# Patient Record
Sex: Female | Born: 1965 | Race: White | Hispanic: No | Marital: Married | Smoking: Never smoker
Health system: Southern US, Community
[De-identification: ages and names within clinical notes are randomized; demographics above are authoritative.]

## PROBLEM LIST (undated history)

## (undated) DIAGNOSIS — E7800 Pure hypercholesterolemia, unspecified: Secondary | ICD-10-CM

## (undated) HISTORY — PX: CHOLECYSTECTOMY: SHX55

---

## 2012-04-08 ENCOUNTER — Encounter: Payer: Self-pay | Admitting: Emergency Medicine

## 2012-04-08 ENCOUNTER — Emergency Department (INDEPENDENT_AMBULATORY_CARE_PROVIDER_SITE_OTHER)
Admission: EM | Admit: 2012-04-08 | Discharge: 2012-04-08 | Disposition: A | Discharge status: Home / Self Care | Payer: BC Managed Care – PPO | Source: Home / Self Care

## 2012-04-08 DIAGNOSIS — B349 Viral infection, unspecified: Secondary | ICD-10-CM

## 2012-04-08 DIAGNOSIS — B9789 Other viral agents as the cause of diseases classified elsewhere: Secondary | ICD-10-CM

## 2012-04-08 LAB — POCT MONO SCREEN (KUC): Mono, POC: NEGATIVE

## 2012-04-08 LAB — POCT CBC W AUTO DIFF (K'VILLE URGENT CARE)

## 2012-04-08 LAB — POCT INFLUENZA A/B
Influenza A, POC: NEGATIVE
Influenza B, POC: NEGATIVE

## 2012-04-08 NOTE — ED Notes (Signed)
Sore throat, chills, body aches,sweats, headache x 3 days

## 2012-04-08 NOTE — ED Provider Notes (Signed)
History     CSN: [id number]  Arrival date & time 04/08/12  1478   First MD Initiated Contact with Patient 04/08/12 1910      Chief Complaint  Patient presents with  . Sore Throat   HPI URI Symptoms Onset: 3 days  Description: rhinorrhea, nasal congestion, sore throat, cough, body aches, chills, headache  Modifying factors:  None   Symptoms Nasal discharge: yes Fever: no Sore throat: yes Cough: yes Wheezing: no Ear pain: no GI symptoms: no Sick contacts: yes; husband with similar sxs   Red Flags  Stiff neck: no Dyspnea: no Rash: no Swallowing difficulty: no  Sinusitis Risk Factors Headache/face pain: yes Double sickening: no tooth pain: no  Allergy Risk Factors Sneezing: no Itchy scratchy throat: no Seasonal symptoms: no  Flu Risk Factors Headache: yes muscle aches: yes severe fatigue: yes    History reviewed. No pertinent past medical history.  Past Surgical History  Procedure Date  . Cholecystectomy   . Cesarean section     No family history on file.  History  Substance Use Topics  . Smoking status: Never Smoker   . Smokeless tobacco: Not on file  . Alcohol Use: Yes    OB History    Grav Para Term Preterm Abortions TAB SAB Ect Mult Living                  Review of Systems  All other systems reviewed and are negative.    Allergies  Erythromycin  Home Medications  No current outpatient prescriptions on file.  BP 127/82  Pulse 99  Temp 98 F (36.7 C) (Oral)  Resp 16  Ht 5\' 6"  (1.676 m)  Wt 141 lb (63.957 kg)  BMI 22.76 kg/m2  SpO2 99%  Physical Exam  Constitutional: She appears well-developed and well-nourished.  HENT:  Head: Normocephalic and atraumatic.  Right Ear: External ear normal.  Left Ear: External ear normal.       +nasal erythema, rhinorrhea bilaterally, + post oropharyngeal erythema  ? Tonsillar exudate   Eyes: Conjunctivae normal are normal. Pupils are equal, round, and reactive to light.  Neck:  Normal range of motion. Neck supple.  Cardiovascular: Normal rate, regular rhythm and normal heart sounds.   Pulmonary/Chest: Effort normal and breath sounds normal.  Abdominal: Soft.  Musculoskeletal: Normal range of motion.  Lymphadenopathy:    She has cervical adenopathy.  Neurological: She is alert.  Skin: Skin is warm.    ED Course  Procedures (including critical care time)   Labs Reviewed  POCT CBC W AUTO DIFF ([facility])  POCT MONO SCREEN ([facility])  POCT INFLUENZA A/B  STREP A DNA PROBE   No results found.   1. Viral illness       MDM  Suspect viral/flu like illness.  Rapid strep, rapid flu, mono negative.  Will add on strep culture given ? Tonsillar exudate.  Discussed supportive care and infectious red flags at length.  Otherwise follow up as needed.     The patient and/or caregiver has been counseled thoroughly with regard to treatment plan and/or medications prescribed including dosage, schedule, interactions, rationale for use, and possible side effects and they verbalize understanding. Diagnoses and expected course of recovery discussed and will return if not improved as expected or if the condition worsens. Patient and/or caregiver verbalized understanding.             Doree Albee, MD 04/08/12 2049

## 2012-04-10 ENCOUNTER — Telehealth: Payer: Self-pay | Admitting: Emergency Medicine

## 2012-04-13 ENCOUNTER — Emergency Department (INDEPENDENT_AMBULATORY_CARE_PROVIDER_SITE_OTHER)
Admission: EM | Admit: 2012-04-13 | Discharge: 2012-04-13 | Disposition: A | Discharge status: Home / Self Care | Payer: BC Managed Care – PPO | Source: Home / Self Care

## 2012-04-13 ENCOUNTER — Emergency Department (INDEPENDENT_AMBULATORY_CARE_PROVIDER_SITE_OTHER): Payer: BC Managed Care – PPO

## 2012-04-13 ENCOUNTER — Encounter: Payer: Self-pay | Admitting: *Deleted

## 2012-04-13 DIAGNOSIS — R0602 Shortness of breath: Secondary | ICD-10-CM

## 2012-04-13 DIAGNOSIS — J069 Acute upper respiratory infection, unspecified: Secondary | ICD-10-CM

## 2012-04-13 DIAGNOSIS — J40 Bronchitis, not specified as acute or chronic: Secondary | ICD-10-CM

## 2012-04-13 DIAGNOSIS — R05 Cough: Secondary | ICD-10-CM

## 2012-04-13 DIAGNOSIS — R918 Other nonspecific abnormal finding of lung field: Secondary | ICD-10-CM

## 2012-04-13 DIAGNOSIS — R062 Wheezing: Secondary | ICD-10-CM

## 2012-04-13 MED ORDER — PREDNISONE 50 MG PO TABS: ORAL_TABLET | ORAL | Status: DC

## 2012-04-13 MED ORDER — AZITHROMYCIN 250 MG PO TABS: ORAL_TABLET | ORAL | Status: DC

## 2012-04-13 MED ORDER — HYDROCOD POLST-CHLORPHEN POLST 10-8 MG/5ML PO LQCR: 5.0000 mL | Freq: Two times a day (BID) | ORAL | Status: DC | PRN

## 2012-04-13 MED ORDER — ALBUTEROL SULFATE HFA 108 (90 BASE) MCG/ACT IN AERS: 2.0000 | INHALATION_SPRAY | RESPIRATORY_TRACT | Status: DC | PRN

## 2012-04-13 MED ORDER — METHYLPREDNISOLONE SODIUM SUCC 125 MG IJ SOLR
125.0000 mg | Freq: Once | INTRAMUSCULAR | Status: AC
  Administered 2012-04-13: 125 mg via INTRAMUSCULAR

## 2012-04-13 NOTE — ED Notes (Signed)
Patient reports feeling worse from the last time she was here 5 days ago. She now has a constant non-productive cough. She has taken Nyquil, Mucinex D, Robitussin and tessalon without relief. Denies fever.

## 2012-04-13 NOTE — ED Provider Notes (Addendum)
History     CSN: [id number]  Arrival date & time 04/13/12  1030   First MD Initiated Contact with Patient 04/13/12 1034      Chief Complaint  Patient presents with  . Cough    HPI URI Symptoms Onset: 1 week  Description: sinus drainage, persistent cough, SOB, wheezing Modifying factors:  Was seen for similar sxs last week, sxs not improved. Pt reports ? Hx/o subclinical asthma in the past. Had no wheezing/SOB last week.   Symptoms Nasal discharge: yes Fever: no Sore throat: no Cough: yes Wheezing: yes Ear pain: no GI symptoms: sour stomach Sick contacts: yes; husband  Red Flags  Stiff neck: no Dyspnea: yes Rash: no Swallowing difficulty: no  Sinusitis Risk Factors Headache/face pain: no Double sickening: no tooth pain: no  Allergy Risk Factors Sneezing: no Itchy scratchy throat: no Seasonal symptoms: no  Flu Risk Factors Headache: no muscle aches: yes severe fatigue: yes   History reviewed. No pertinent past medical history.  Past Surgical History  Procedure Date  . Cholecystectomy   . Cesarean section     No family history on file.  History  Substance Use Topics  . Smoking status: Never Smoker   . Smokeless tobacco: Not on file  . Alcohol Use: No    OB History    Grav Para Term Preterm Abortions TAB SAB Ect Mult Living                  Review of Systems  All other systems reviewed and are negative.    Allergies  Erythromycin  Home Medications  No current outpatient prescriptions on file.  BP 103/73  Pulse 99  Temp 98 F (36.7 C) (Oral)  Resp 16  Wt 135 lb (61.236 kg)  SpO2 100%  Physical Exam  Constitutional: She appears well-developed and well-nourished.  HENT:  Head: Normocephalic and atraumatic.  Right Ear: External ear normal.  Left Ear: External ear normal.       +nasal erythema, rhinorrhea bilaterally, + post oropharyngeal erythema    Eyes: Conjunctivae normal are normal. Pupils are equal, round, and reactive  to light.  Neck: Normal range of motion. Neck supple.  Cardiovascular: Normal rate and regular rhythm.   Pulmonary/Chest: Effort normal.       Faint wheezes diffusely Mild rales in LLL   Abdominal: Soft.  Musculoskeletal: Normal range of motion.  Lymphadenopathy:    She has cervical adenopathy.  Neurological: She is alert.  Skin: Skin is warm.    ED Course  Procedures (including critical care time)  Labs Reviewed - No data to display Dg Chest 2 View  04/13/2012  *RADIOLOGY REPORT*  Clinical Data: Cough for 1 week, shortness of breath  CHEST - 2 VIEW  Comparison: None.  Findings: Minimally prominent markings are noted on the lateral view either within the right middle lobe or lingula and a patchy pneumonia cannot be excluded.  There is some peribronchial thickening which may indicate bronchitis.  No effusion is noted. The heart is within normal limits in size.  No bony abnormality is seen.  IMPRESSION:  1.  Peribronchial thickening consistent with bronchitis. 2.  Minimally prominent markings anteriorly on the lateral view. Cannot exclude small infiltrate within the right middle lobe or lingula. Consider follow-up chest x-ray.   Original Report Authenticated By: Dwyane Dee, M.D.      1. URI (upper respiratory infection)   2. Bronchitis   3. Wheezing       MDM  Solumedrol  125mg  IM x1 Prednisone 50mg  daily x 5 days.  Zpak for atypical/PNA coverage.  Tussionex for cough.  Albuterol for home use. If this improves cough/sxs, would recommend outpt PFT 4-6 weeks after resolution of sxs.     The patient and/or caregiver has been counseled thoroughly with regard to treatment plan and/or medications prescribed including dosage, schedule, interactions, rationale for use, and possible side effects and they verbalize understanding. Diagnoses and expected course of recovery discussed and will return if not improved as expected or if the condition worsens. Patient and/or caregiver verbalized  understanding.             Doree Albee, MD 04/13/12 1157  Doree Albee, MD 04/13/12 [phone]

## 2012-04-18 ENCOUNTER — Emergency Department (INDEPENDENT_AMBULATORY_CARE_PROVIDER_SITE_OTHER): Payer: BC Managed Care – PPO

## 2012-04-18 ENCOUNTER — Emergency Department (INDEPENDENT_AMBULATORY_CARE_PROVIDER_SITE_OTHER)
Admission: EM | Admit: 2012-04-18 | Discharge: 2012-04-18 | Disposition: A | Discharge status: Home / Self Care | Payer: BC Managed Care – PPO | Source: Home / Self Care

## 2012-04-18 DIAGNOSIS — R05 Cough: Secondary | ICD-10-CM

## 2012-04-18 DIAGNOSIS — J069 Acute upper respiratory infection, unspecified: Secondary | ICD-10-CM

## 2012-04-18 MED ORDER — OMEPRAZOLE 20 MG PO CPDR: 20.0000 mg | DELAYED_RELEASE_CAPSULE | Freq: Every day | ORAL | Status: DC

## 2012-04-18 MED ORDER — PREDNISONE 50 MG PO TABS: ORAL_TABLET | ORAL | Status: DC

## 2012-04-18 MED ORDER — CETIRIZINE HCL 10 MG PO TABS: 10.0000 mg | ORAL_TABLET | Freq: Every day | ORAL | Status: DC

## 2012-04-18 NOTE — ED Provider Notes (Signed)
History     CSN: [id number]  Arrival date & time 04/18/12  1114   First MD Initiated Contact with Patient 04/18/12 1129      Chief Complaint  Patient presents with  . Cough    Follow up cough   HPI Pt presents today for follow up of URI/asthma exacerbation/bronchitis.  Pt was placed on course of prednisone and zpak. Pt states that she is symptomtically improved with cough and SOB.  Pt states that she has had persistent sinus drainage as well as cough (though improved).  No SOB.  Has had mild fatigue. Pt states that she still has been working.  No fevers, chills.   History reviewed. No pertinent past medical history.  Past Surgical History  Procedure Date  . Cholecystectomy   . Cesarean section     Family History  Problem Relation Age of Onset  . Cancer Other     BREAST    History  Substance Use Topics  . Smoking status: Never Smoker   . Smokeless tobacco: Not on file  . Alcohol Use: No    OB History    Grav Para Term Preterm Abortions TAB SAB Ect Mult Living                  Review of Systems  All other systems reviewed and are negative.    Allergies  Erythromycin  Home Medications   Current Outpatient Rx  Name  Route  Sig  Dispense  Refill  . ALBUTEROL SULFATE HFA 108 (90 BASE) MCG/ACT IN AERS   Inhalation   Inhale 2 puffs into the lungs every 4 (four) hours as needed for wheezing (cough, shortness of breath or wheezing.).   1 Inhaler   1   . AZITHROMYCIN 250 MG PO TABS      Take 2 tabs PO x 1 dose, then 1 tab PO QD x 4 days   6 tablet   0   . HYDROCOD POLST-CPM POLST ER 10-8 MG/5ML PO LQCR   Oral   Take 5 mLs by mouth every 12 (twelve) hours as needed (cough).   60 mL   0   . PREDNISONE 50 MG PO TABS      1 tab daily x 5 days   5 tablet   0     BP 110/73  Pulse 73  Temp 97.8 F (36.6 C) (Oral)  Ht 5\' 6"  (1.676 m)  Wt 136 lb (61.689 kg)  BMI 21.95 kg/m2  SpO2 98%  LMP 03/25/2012  Physical Exam  Constitutional: She  appears well-developed and well-nourished.  HENT:  Head: Normocephalic and atraumatic.  Right Ear: External ear normal.  Left Ear: External ear normal.       +nasal erythema, rhinorrhea bilaterally, + post oropharyngeal erythema    Eyes: Conjunctivae normal are normal. Pupils are equal, round, and reactive to light.  Neck: Normal range of motion. Neck supple.  Cardiovascular: Normal rate, regular rhythm and normal heart sounds.   Pulmonary/Chest: Effort normal and breath sounds normal. She has no wheezes.  Abdominal: Soft. Bowel sounds are normal.  Musculoskeletal: Normal range of motion.  Lymphadenopathy:    She has no cervical adenopathy.  Neurological: She is alert.  Skin: Skin is warm.    ED Course  Procedures (including critical care time)  Labs Reviewed - No data to display Dg Chest 2 View  04/18/2012  *RADIOLOGY REPORT*  Clinical Data:  Cough.  CHEST - 2 VIEW  Comparison: 04/13/2012  Findings:  The heart size and mediastinal contours are within normal limits.  Both lungs are clear.  The visualized skeletal structures are unremarkable.  IMPRESSION: No active disease.   Original Report Authenticated By: Irish Lack, M.D.      1. URI (upper respiratory infection)   2. Cough       MDM  Suspect this is likely multifactorial with contributions of URI/post viral cough, rhinorrhea, asthma, as well as uncontrolled reflux (pt states that she has reflux flare while on prednisone-untreated).  CXR negative for PNA.  Asthma seems relatively improved from last clinical visit based on exam and pt history.  Zyrtec for rhinorrhea (likely allergic component).  Prilosec for reflux.  Prednisone refill given if cough or asthma flares despite treatment with albuterol.  Discussed infectious and resp red flags at length.  Also discussed importance of adequate rest.  Plan for follow up with PCP in 7-10 days if sxs not improved.      The patient and/or caregiver has been counseled  thoroughly with regard to treatment plan and/or medications prescribed including dosage, schedule, interactions, rationale for use, and possible side effects and they verbalize understanding. Diagnoses and expected course of recovery discussed and will return if not improved as expected or if the condition worsens. Patient and/or caregiver verbalized understanding.              Doree Albee, MD 04/18/12 1248

## 2012-04-18 NOTE — ED Notes (Signed)
Jillian Meza is here for follow up cough, chest congestion and fatigue. She feels better. She is still very fatigued and has night sweats.

## 2012-11-29 ENCOUNTER — Emergency Department
Admission: EM | Admit: 2012-11-29 | Discharge: 2012-11-29 | Disposition: A | Discharge status: Home / Self Care | Payer: BC Managed Care – PPO | Source: Home / Self Care | Attending: Family Medicine | Admitting: Family Medicine

## 2012-11-29 ENCOUNTER — Encounter: Payer: Self-pay | Admitting: Emergency Medicine

## 2012-11-29 DIAGNOSIS — R300 Dysuria: Secondary | ICD-10-CM

## 2012-11-29 DIAGNOSIS — N3000 Acute cystitis without hematuria: Secondary | ICD-10-CM

## 2012-11-29 LAB — POCT URINALYSIS DIP (MANUAL ENTRY)
Bilirubin, UA: NEGATIVE
Ketones, POC UA: NEGATIVE
Protein Ur, POC: NEGATIVE
Spec Grav, UA: <=1.005 (ref 1.005–1.03)
pH, UA: 6.5 (ref 5–8)

## 2012-11-29 MED ORDER — PHENAZOPYRIDINE HCL 200 MG PO TABS: 200.0000 mg | ORAL_TABLET | Freq: Three times a day (TID) | ORAL | Status: DC

## 2012-11-29 MED ORDER — SULFAMETHOXAZOLE-TRIMETHOPRIM 800-160 MG PO TABS: 1.0000 | ORAL_TABLET | Freq: Two times a day (BID) | ORAL | Status: DC

## 2012-11-29 NOTE — ED Notes (Signed)
Reports intermittent dysuria x 3 days; noticed hematuria and more pain this morning. No OTCs.

## 2012-11-29 NOTE — ED Provider Notes (Signed)
History    CSN: [id number] Arrival date & time 11/29/12  1124  First MD Initiated Contact with Patient 11/29/12 1225     Chief Complaint  Patient presents with  . Dysuria      HPI Comments: Patient complains of 3 day history of bladder pressure.  Yesterday she developed dysuria, nocturia, and hematuria.    Patient is a 47 y.o. female presenting with dysuria. The history is provided by the patient.  Dysuria Pain quality:  Burning Pain severity:  Mild Onset quality:  Gradual Duration:  3 days Timing:  Constant Progression:  Worsening Chronicity:  New Recent urinary tract infections: no   Relieved by:  Nothing Worsened by:  Nothing tried Ineffective treatments:  None tried Urinary symptoms: discolored urine, frequent urination, hematuria and hesitancy   Urinary symptoms: no foul-smelling urine and no bladder incontinence   Associated symptoms: no abdominal pain, no fever, no flank pain, no genital lesions, no nausea, no vaginal discharge and no vomiting    History reviewed. No pertinent past medical history. Past Surgical History  Procedure Laterality Date  . Cholecystectomy    . Cesarean section     Family History  Problem Relation Age of Onset  . Cancer Other     BREAST   History  Substance Use Topics  . Smoking status: Never Smoker   . Smokeless tobacco: Not on file  . Alcohol Use: No   OB History   Grav Para Term Preterm Abortions TAB SAB Ect Mult Living                 Review of Systems  Constitutional: Negative for fever.  Gastrointestinal: Negative for nausea, vomiting and abdominal pain.  Genitourinary: Positive for dysuria. Negative for flank pain and vaginal discharge.  All other systems reviewed and are negative.    Allergies  Erythromycin  Home Medications   Current Outpatient Rx  Name  Route  Sig  Dispense  Refill  . albuterol (PROVENTIL HFA;VENTOLIN HFA) 108 (90 BASE) MCG/ACT inhaler   Inhalation   Inhale 2 puffs into the lungs every 4  (four) hours as needed for wheezing (cough, shortness of breath or wheezing.).   1 Inhaler   1   . cetirizine (ZYRTEC) 10 MG tablet   Oral   Take 1 tablet (10 mg total) by mouth daily.   30 tablet   3   . omeprazole (PRILOSEC) 20 MG capsule   Oral   Take 1 capsule (20 mg total) by mouth daily.   30 capsule   3   . phenazopyridine (PYRIDIUM) 200 MG tablet   Oral   Take 1 tablet (200 mg total) by mouth 3 (three) times daily. Take with food.   6 tablet   0   . predniSONE (DELTASONE) 50 MG tablet      1 tab daily x 5 days   5 tablet   0   . sulfamethoxazole-trimethoprim (BACTRIM DS,SEPTRA DS) 800-160 MG per tablet   Oral   Take 1 tablet by mouth 2 (two) times daily.   10 tablet   0    BP 94/60  Pulse 79  Temp(Src) 97.6 F (36.4 C) (Oral)  Resp 16  Ht 5\' 6"  (1.676 m)  Wt 138 lb (62.596 kg)  BMI 22.28 kg/m2  SpO2 100%  LMP 11/16/2012 Physical Exam Nursing notes and Vital Signs reviewed. Appearance:  Patient appears healthy, stated age, and in no acute distress Eyes:  Pupils are equal, round, and reactive to  light and accomodation.  Extraocular movement is intact.  Conjunctivae are not inflamed  Pharynx:  Normal Neck:  Supple.  No adenopathy Lungs:  Clear to auscultation.  Breath sounds are equal.  Heart:  Regular rate and rhythm without murmurs, rubs, or gallops.  Abdomen:  Nontender without masses or hepatosplenomegaly.  Bowel sounds are present.  No CVA or flank tenderness.  Extremities:  No edema.  No calf tenderness Skin:  No rash present.    ED Course  Procedures  None   Labs Reviewed  POCT URINALYSIS DIP (MANUAL ENTRY)  BLO moderate; LEU small  URINE CULTURE pending    1. Dysuria   2. Acute cystitis     MDM  Urine culture pending.  Begin Septra DS and Pyridium Continue increased fluid intake. Followup with Family Doctor if not improved in 5 days.  Lattie Haw, MD 12/03/12 1051

## 2012-12-02 ENCOUNTER — Telehealth: Payer: Self-pay

## 2012-12-02 LAB — URINE CULTURE

## 2012-12-02 NOTE — ED Notes (Signed)
Left message for patient to call back  

## 2012-12-03 ENCOUNTER — Telehealth: Payer: Self-pay | Admitting: Emergency Medicine

## 2014-03-18 ENCOUNTER — Emergency Department (INDEPENDENT_AMBULATORY_CARE_PROVIDER_SITE_OTHER)
Admission: EM | Admit: 2014-03-18 | Discharge: 2014-03-18 | Disposition: A | Discharge status: Home / Self Care | Payer: BC Managed Care – PPO | Source: Home / Self Care | Attending: Emergency Medicine | Admitting: Emergency Medicine

## 2014-03-18 ENCOUNTER — Encounter: Payer: Self-pay | Admitting: Emergency Medicine

## 2014-03-18 DIAGNOSIS — J069 Acute upper respiratory infection, unspecified: Secondary | ICD-10-CM

## 2014-03-18 MED ORDER — AMOXICILLIN 875 MG PO TABS: 875.0000 mg | ORAL_TABLET | Freq: Two times a day (BID) | ORAL | Status: DC

## 2014-03-18 NOTE — ED Provider Notes (Addendum)
CSN: [id number]     Arrival date & time 03/18/14  1557 History   First MD Initiated Contact with Patient 03/18/14 1634     Chief Complaint  Patient presents with  . URI   (Consider location/radiation/quality/duration/timing/severity/associated sxs/prior Treatment) HPI Toniann FailDonya is a 48 y.o. female who complains of onset of cold symptoms for a few days.  The symptoms are constant and mild-moderate in severity.  Her husband was sick or this week but is feeling better.  She does have a history of allergies. + sore throat + cough No pleuritic pain No wheezing No nasal congestion + post-nasal drainage +++ sinus pain/pressure No chest congestion No itchy/red eyes No earache No hemoptysis No SOB No chills/sweats No fever No nausea No vomiting No abdominal pain No diarrhea No skin rashes No fatigue + myalgias + headache     History reviewed. No pertinent past medical history. Past Surgical History  Procedure Laterality Date  . Cholecystectomy    . Cesarean section     Family History  Problem Relation Age of Onset  . Cancer Other     BREAST   History  Substance Use Topics  . Smoking status: Never Smoker   . Smokeless tobacco: Not on file  . Alcohol Use: No   OB History   Grav Para Term Preterm Abortions TAB SAB Ect Mult Living                 Review of Systems  All other systems reviewed and are negative.   Allergies  Codeine; Erythromycin; and Percocet  Home Medications   Prior to Admission medications   Medication Sig Start Date End Date Taking? Authorizing Provider  acetaminophen (TYLENOL) 325 MG tablet Take 650 mg by mouth every 6 (six) hours as needed.   Yes Historical Provider, MD  pseudoephedrine-guaifenesin (MUCINEX D) 60-600 MG per tablet Take 1 tablet by mouth every 12 (twelve) hours.   Yes Historical Provider, MD  sodium chloride (BRONCHO SALINE) inhaler solution Take 1 spray by nebulization as needed.   Yes Historical Provider, MD  albuterol  (PROVENTIL HFA;VENTOLIN HFA) 108 (90 BASE) MCG/ACT inhaler Inhale 2 puffs into the lungs every 4 (four) hours as needed for wheezing (cough, shortness of breath or wheezing.). 04/13/12   Doree AlbeeSteven Newton, MD  amoxicillin (AMOXIL) 875 MG tablet Take 1 tablet (875 mg total) by mouth 2 (two) times daily. 03/18/14   Marlaine HindJeffrey H Sahan Pen, MD  cetirizine (ZYRTEC) 10 MG tablet Take 1 tablet (10 mg total) by mouth daily. 04/18/12   Doree AlbeeSteven Newton, MD  omeprazole (PRILOSEC) 20 MG capsule Take 1 capsule (20 mg total) by mouth daily. 04/18/12   Doree AlbeeSteven Newton, MD  phenazopyridine (PYRIDIUM) 200 MG tablet Take 1 tablet (200 mg total) by mouth 3 (three) times daily. Take with food. 11/29/12   Lattie HawStephen A Beese, MD  predniSONE (DELTASONE) 50 MG tablet 1 tab daily x 5 days 04/18/12   Doree AlbeeSteven Newton, MD  sulfamethoxazole-trimethoprim (BACTRIM DS,SEPTRA DS) 800-160 MG per tablet Take 1 tablet by mouth 2 (two) times daily. 11/29/12   Lattie HawStephen A Beese, MD   BP 110/72  Pulse 77  Temp(Src) 97.7 F (36.5 C) (Oral)  Ht 5\' 6"  (1.676 m)  Wt 139 lb (63.05 kg)  BMI 22.45 kg/m2  SpO2 100% Physical Exam  Nursing note and vitals reviewed. Constitutional: She is oriented to person, place, and time. She appears well-developed and well-nourished.  HENT:  Head: Normocephalic and atraumatic.  Right Ear: Tympanic membrane, external ear and ear  canal normal.  Left Ear: Tympanic membrane, external ear and ear canal normal.  Nose: Mucosal edema and rhinorrhea present.  Mouth/Throat: Posterior oropharyngeal erythema present. No oropharyngeal exudate or posterior oropharyngeal edema.  Eyes: No scleral icterus.  Neck: Neck supple.  Cardiovascular: Regular rhythm and normal heart sounds.   Pulmonary/Chest: Effort normal and breath sounds normal. No respiratory distress.  Neurological: She is alert and oriented to person, place, and time.  Skin: Skin is warm and dry.  Psychiatric: She has a normal mood and affect. Her speech is normal.     ED Course  Procedures (including critical care time) Labs Review Labs Reviewed - No data to display  Imaging Review No results found.   MDM   1. Upper respiratory infection    1)  Take the prescribed antibiotic as instructed.  If not better days would suggest consider prednisone 2)  Use nasal saline solution (over the counter) at least 3 times a day. 3)  Use over the counter decongestants like Zyrtec-D every 12 hours as needed to help with congestion.  If you have hypertension, do not take medicines with sudafed.  4)  Can take tylenol every 6 hours or motrin every 8 hours for pain or fever. 5)  Follow up with your primary doctor if no improvement in 5-7 days, sooner if increasing pain, fever, or new symptoms.     Marlaine HindJeffrey H Dorean Daniello, MD 03/18/14 1635  Marlaine HindJeffrey H Robina Hamor, MD 03/18/14 [phone]

## 2014-03-18 NOTE — ED Notes (Signed)
Cough, sneezing, congestion, facial pain radiating to right neck pain causing headache x 5 days

## 2014-04-12 IMAGING — CR DG CHEST 2V
2 series · 2 of 2 positions shown · non-contrast
Comparison: 04/13/2012

CLINICAL DATA: Cough.

CHEST - 2 VIEW

[view not recorded (1 of 2)]
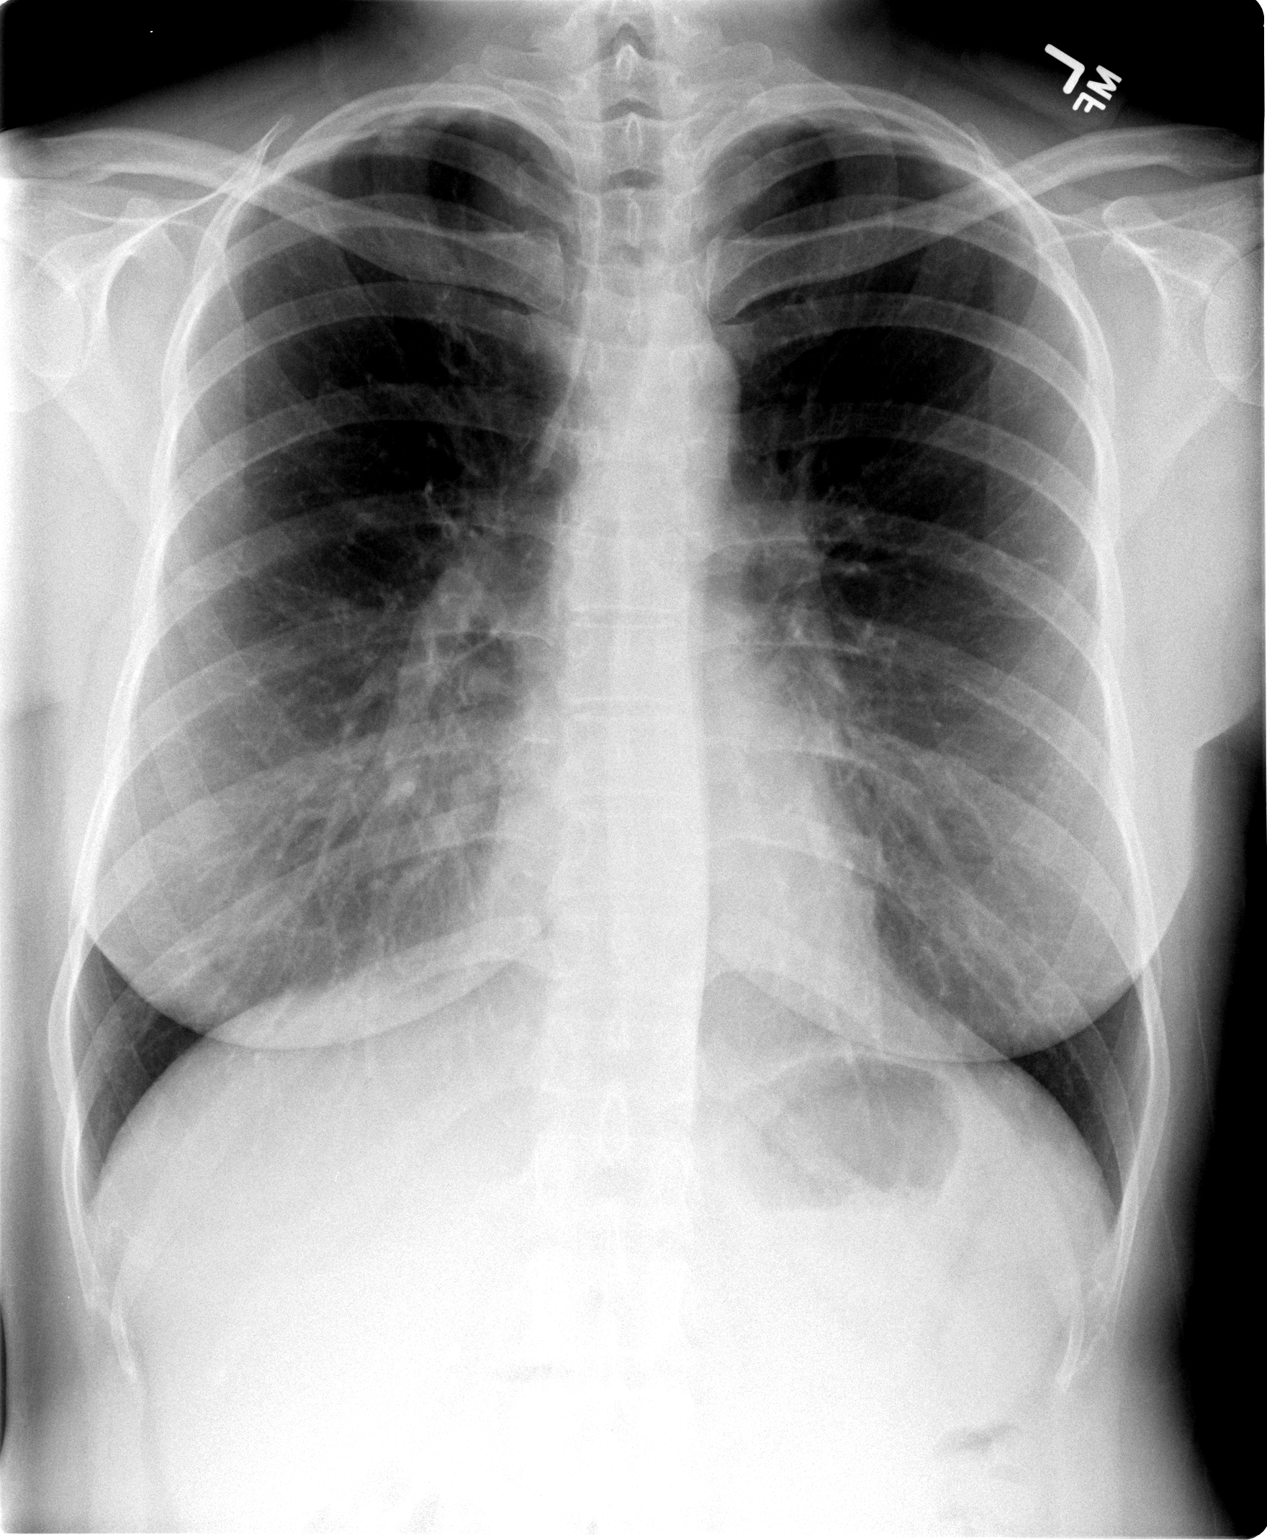

[view not recorded (2 of 2)]
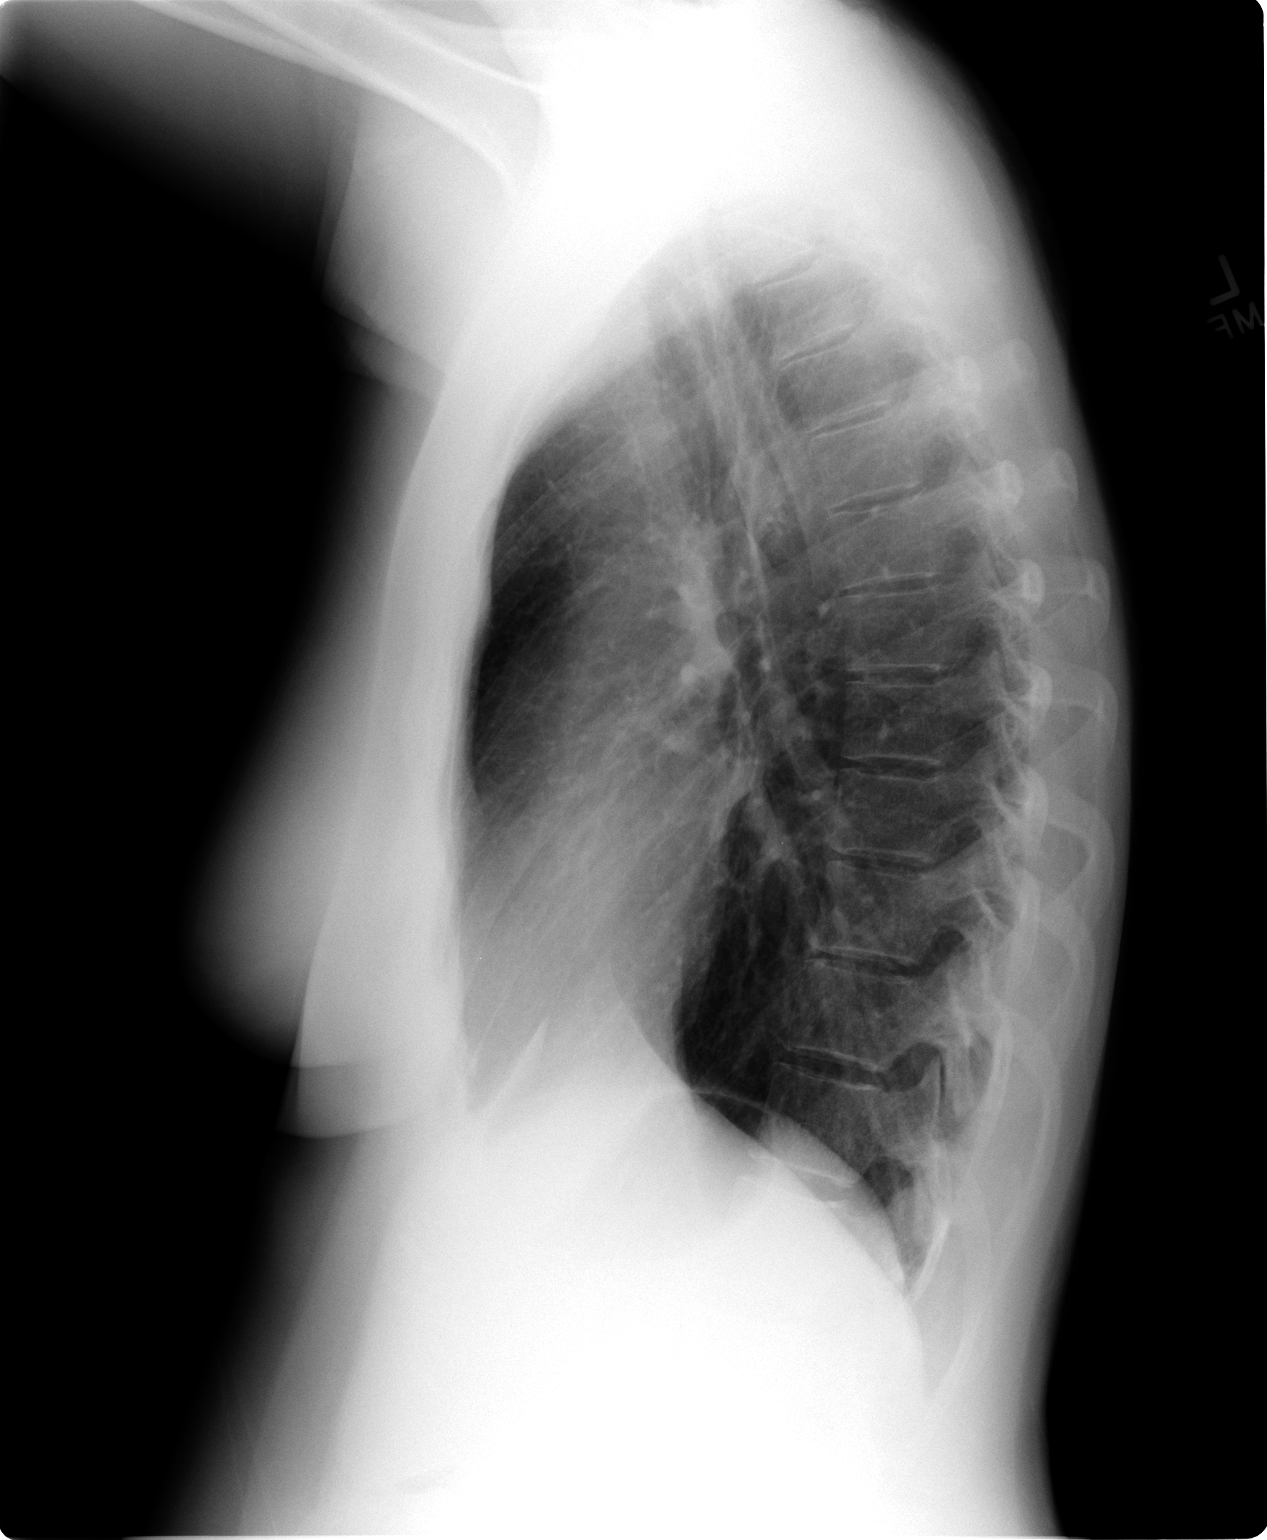

[2 of 2 positions shown; findings below may reference images not displayed]

FINDINGS: The heart size and mediastinal contours are within normal
limits.  Both lungs are clear.  The visualized skeletal structures
are unremarkable.
IMPRESSION: No active disease.

## 2016-07-09 ENCOUNTER — Emergency Department (INDEPENDENT_AMBULATORY_CARE_PROVIDER_SITE_OTHER)
Admission: EM | Admit: 2016-07-09 | Discharge: 2016-07-09 | Disposition: A | Discharge status: Home / Self Care | Payer: BLUE CROSS/BLUE SHIELD | Source: Home / Self Care | Attending: Family Medicine | Admitting: Family Medicine

## 2016-07-09 DIAGNOSIS — R51 Headache: Secondary | ICD-10-CM

## 2016-07-09 DIAGNOSIS — R69 Illness, unspecified: Secondary | ICD-10-CM | POA: Diagnosis not present

## 2016-07-09 DIAGNOSIS — J111 Influenza due to unidentified influenza virus with other respiratory manifestations: Secondary | ICD-10-CM

## 2016-07-09 DIAGNOSIS — R519 Headache, unspecified: Secondary | ICD-10-CM

## 2016-07-09 HISTORY — DX: Pure hypercholesterolemia, unspecified: E78.00

## 2016-07-09 MED ORDER — KETOROLAC TROMETHAMINE 60 MG/2ML IJ SOLN
60.0000 mg | Freq: Once | INTRAMUSCULAR | Status: AC
  Administered 2016-07-09: 60 mg via INTRAMUSCULAR

## 2016-07-09 MED ORDER — METOCLOPRAMIDE HCL 5 MG/ML IJ SOLN
5.0000 mg | Freq: Once | INTRAMUSCULAR | Status: AC
  Administered 2016-07-09: 5 mg via INTRAMUSCULAR

## 2016-07-09 MED ORDER — OSELTAMIVIR PHOSPHATE 75 MG PO CAPS: 75.0000 mg | ORAL_CAPSULE | Freq: Two times a day (BID) | ORAL | 0 refills | Status: DC

## 2016-07-09 MED ORDER — DEXAMETHASONE SODIUM PHOSPHATE 10 MG/ML IJ SOLN
10.0000 mg | Freq: Once | INTRAMUSCULAR | Status: AC
  Administered 2016-07-09: 10 mg via INTRAMUSCULAR

## 2016-07-09 NOTE — Discharge Instructions (Signed)
If cough and congestion develop, try the following: Take plain guaifenesin (1200mg  extended release tabs such as Mucinex) twice daily, with plenty of water, for cough and congestion.   Get adequate rest.   Try warm salt water gargles for sore throat.  Stop all antihistamines for now, and other non-prescription cough/cold preparations May take Delsym Cough Suppressant at bedtime for nighttime cough.  Follow-up with family doctor if not improving about10 days.

## 2016-07-09 NOTE — ED Triage Notes (Signed)
Pt has had a headache for about 2 months.  Feels like a migraine at times.  Has used tylenol, ibuprofen and aleve, but never goes away completely.  Started with fatigue, dizziness lightheadedness, with pain down the neck today.

## 2016-07-09 NOTE — ED Provider Notes (Signed)
[facility]    CSN: [id number] Arrival date & time: 07/09/16  1220     History   Chief Complaint Chief Complaint  Patient presents with  . Headache  . Generalized Body Aches  . Fatigue    HPI Jillian Meza is a 51 y.o. female.   Patient has a history of recurring headaches for years, usually on the right side, now worse during the past two months.  Three days ago she developed fatigue, nausea, chills/sweats, and increased headache followed by myalgias and chills/sweats.  No cough   The history is provided by the patient.    Past Medical History:  Diagnosis Date  . High cholesterol     There are no active problems to display for this patient.   Past Surgical History:  Procedure Laterality Date  . CESAREAN SECTION    . CHOLECYSTECTOMY      OB History    No data available       Home Medications    Prior to Admission medications   Medication Sig Start Date End Date Taking? Authorizing Provider  acetaminophen (TYLENOL) 325 MG tablet Take 650 mg by mouth every 6 (six) hours as needed.    Historical Provider, MD  albuterol (PROVENTIL HFA;VENTOLIN HFA) 108 (90 BASE) MCG/ACT inhaler Inhale 2 puffs into the lungs every 4 (four) hours as needed for wheezing (cough, shortness of breath or wheezing.). 04/13/12   Floydene Flock, MD  amoxicillin (AMOXIL) 875 MG tablet Take 1 tablet (875 mg total) by mouth 2 (two) times daily. 03/18/14   Marlaine Hind, MD  cetirizine (ZYRTEC) 10 MG tablet Take 1 tablet (10 mg total) by mouth daily. 04/18/12   Floydene Flock, MD  omeprazole (PRILOSEC) 20 MG capsule Take 1 capsule (20 mg total) by mouth daily. 04/18/12   Floydene Flock, MD  oseltamivir (TAMIFLU) 75 MG capsule Take 1 capsule (75 mg total) by mouth every 12 (twelve) hours. 07/09/16   Lattie Haw, MD  phenazopyridine (PYRIDIUM) 200 MG tablet Take 1 tablet (200 mg total) by mouth 3 (three) times daily. Take with food. 11/29/12   Lattie Haw, MD  predniSONE  (DELTASONE) 50 MG tablet 1 tab daily x 5 days 04/18/12   Floydene Flock, MD  pseudoephedrine-guaifenesin [facility] D) 60-600 MG per tablet Take 1 tablet by mouth every 12 (twelve) hours.    Historical Provider, MD  sodium chloride (BRONCHO SALINE) inhaler solution Take 1 spray by nebulization as needed.    Historical Provider, MD  sulfamethoxazole-trimethoprim (BACTRIM DS,SEPTRA DS) 800-160 MG per tablet Take 1 tablet by mouth 2 (two) times daily. 11/29/12   Lattie Haw, MD    Family History Family History  Problem Relation Age of Onset  . Cancer Other     BREAST    Social History Social History  Substance Use Topics  . Smoking status: Never Smoker  . Smokeless tobacco: Not on file  . Alcohol use No     Allergies   Codeine; Erythromycin; and Percocet [oxycodone-acetaminophen]   Review of Systems Review of Systems  No sore throat No cough No pleuritic pain No wheezing ? nasal congestion No post-nasal drainage No sinus pain/pressure No itchy/red eyes No earache No hemoptysis No SOB No fever, + chills/sweats No nausea No vomiting No abdominal pain No diarrhea No urinary symptoms No skin rash + fatigue + myalgias + headache Used OTC meds without relief   Physical Exam Triage Vital Signs ED Triage Vitals  Enc  Vitals Group     BP 07/09/16 1338 120/77     Pulse Rate 07/09/16 1338 82     Resp --      Temp 07/09/16 1338 97.9 F (36.6 C)     Temp Source 07/09/16 1338 Oral     SpO2 07/09/16 1338 100 %     Weight 07/09/16 1339 144 lb (65.3 kg)     Height 07/09/16 1339 5\' 6"  (1.676 m)     Head Circumference --      Peak Flow --      Pain Score 07/09/16 1340 7     Pain Loc --      Pain Edu? --      Excl. in GC? --    No data found.   Updated Vital Signs BP 120/77 (BP Location: Left Arm)   Pulse 82   Temp 97.9 F (36.6 C) (Oral)   Ht 5\' 6"  (1.676 m)   Wt 144 lb (65.3 kg)   LMP 07/07/2016   SpO2 100%   BMI 23.24 kg/m   Visual Acuity Right Eye  Distance:   Left Eye Distance:   Bilateral Distance:    Right Eye Near:   Left Eye Near:    Bilateral Near:     Physical Exam Nursing notes and Vital Signs reviewed. Appearance:  Patient appears stated age, and in no acute distress Eyes:  Pupils are equal, round, and reactive to light and accomodation.  Extraocular movement is intact.  Conjunctivae are not inflamed.  Fundi benign, no photophobia.  Ears:  Canals normal.  Tympanic membranes normal.  Nose:  Mildly congested turbinates.  No sinus tenderness.   Pharynx:  Normal Neck:  Supple.  Tender enlarged posterior/lateral nodes are palpated bilaterally  Lungs:  Clear to auscultation.  Breath sounds are equal.  Moving air well. Heart:  Regular rate and rhythm without murmurs, rubs, or gallops.  Abdomen:  Nontender without masses or hepatosplenomegaly.  Bowel sounds are present.  No CVA or flank tenderness.  Extremities:  No edema.  Skin:  No rash present.  Neurologic:  Cranial nerves 2 through 12 are normal.  Patellar, achilles, and elbow reflexes are normal.  Cerebellar function is intact (finger-to-nose and rapid alternating hand movement).  Gait and station are normal.    UC Treatments / Results  Labs (all labs ordered are listed, but only abnormal results are displayed) Labs Reviewed - No data to display  EKG  EKG Interpretation None       Radiology No results found.  Procedures Procedures (including critical care time)  Medications Ordered in UC Medications  ketorolac (TORADOL) injection 60 mg (not administered)  dexamethasone (DECADRON) injection 10 mg (not administered)  metoCLOPramide (REGLAN) injection 5 mg (not administered)     Initial Impression / Assessment and Plan / UC Course  I have reviewed the triage vital signs and the nursing notes.  Pertinent labs & imaging results that were available during my care of the patient were reviewed by me and considered in my medical decision making (see chart for  details).    Administered Toradol 60mg  IM  Administered Decadron 10mg  IM.  Administered Reglan 5mg  IM.  Begin Tamiflu. If cough and congestion develop, try the following: Take plain guaifenesin (1200mg  extended release tabs such as Mucinex) twice daily, with plenty of water, for cough and congestion.   Get adequate rest.   Try warm salt water gargles for sore throat.  Stop all antihistamines for now, and other non-prescription cough/cold  preparations May take Delsym Cough Suppressant at bedtime for nighttime cough.  Follow-up with family doctor if not improving about10 days.  Followup with PCP for headache management.    Final Clinical Impressions(s) / UC Diagnoses   Final diagnoses:  Influenza-like illness  Acute intractable headache, unspecified headache type    New Prescriptions New Prescriptions   OSELTAMIVIR (TAMIFLU) 75 MG CAPSULE    Take 1 capsule (75 mg total) by mouth every 12 (twelve) hours.     Lattie Haw, MD 07/15/16 2258

## 2016-12-20 ENCOUNTER — Emergency Department (INDEPENDENT_AMBULATORY_CARE_PROVIDER_SITE_OTHER)
Admission: EM | Admit: 2016-12-20 | Discharge: 2016-12-20 | Disposition: A | Discharge status: Home / Self Care | Payer: BLUE CROSS/BLUE SHIELD | Source: Home / Self Care | Attending: Family Medicine | Admitting: Family Medicine

## 2016-12-20 ENCOUNTER — Encounter: Payer: Self-pay | Admitting: Emergency Medicine

## 2016-12-20 DIAGNOSIS — R42 Dizziness and giddiness: Secondary | ICD-10-CM

## 2016-12-20 DIAGNOSIS — J019 Acute sinusitis, unspecified: Secondary | ICD-10-CM

## 2016-12-20 DIAGNOSIS — R110 Nausea: Secondary | ICD-10-CM

## 2016-12-20 LAB — POCT CBC W AUTO DIFF (K'VILLE URGENT CARE)

## 2016-12-20 MED ORDER — AMOXICILLIN-POT CLAVULANATE 875-125 MG PO TABS: 1.0000 | ORAL_TABLET | Freq: Two times a day (BID) | ORAL | 0 refills | Status: DC

## 2016-12-20 MED ORDER — ONDANSETRON 4 MG PO TBDP: 4.0000 mg | ORAL_TABLET | Freq: Three times a day (TID) | ORAL | 0 refills | Status: DC | PRN

## 2016-12-20 MED ORDER — PREDNISONE 20 MG PO TABS: ORAL_TABLET | ORAL | 0 refills | Status: DC

## 2016-12-20 NOTE — ED Provider Notes (Signed)
CSN: [id number]     Arrival date & time 12/20/16  1756 History   First MD Initiated Contact with Patient 12/20/16 1817     Chief Complaint  Patient presents with  . Nasal Congestion   (Consider location/radiation/quality/duration/timing/severity/associated sxs/prior Treatment) HPI  Janeece AgeeDonya Baccam is a 51 y.o. female presenting to UC with c/o 1-2 weeks of nasal congestion, fatigue, and intermittent episodes of dizziness and nausea.  She had was hospitalized for a hysteroscopy, D&C with myosure on 12/09/16 and had her post-op visit yesterday. She has been feeling unwell with flu-like symptoms since the day of her surgery but states there was no physical exam performed yesterday.  She has taken Dayquil and Nyquil without relief.  She does have a prescription for ondansatron but has not taken it as the nausea only lasts for about 10 minutes.  She has been eating and drinking well.  Denies recorded fever but has had chills. Denies vomiting or diarrhea. She has had sinus infections in the past but does not recall if she was dizzy or not then.    Past Medical History:  Diagnosis Date  . High cholesterol    Past Surgical History:  Procedure Laterality Date  . CESAREAN SECTION    . CHOLECYSTECTOMY     Family History  Problem Relation Age of Onset  . Cancer Other        BREAST   Social History  Substance Use Topics  . Smoking status: Never Smoker  . Smokeless tobacco: Never Used  . Alcohol use No   OB History    No data available     Review of Systems  Constitutional: Positive for chills, fatigue and fever (subjective).  HENT: Positive for congestion and sinus pressure. Negative for ear pain, sore throat, trouble swallowing and voice change.   Respiratory: Negative for cough and shortness of breath.   Cardiovascular: Negative for chest pain and palpitations.  Gastrointestinal: Positive for nausea. Negative for abdominal pain, diarrhea and vomiting.  Musculoskeletal: Negative for  arthralgias, back pain and myalgias.  Skin: Negative for rash.  Neurological: Positive for dizziness and headaches. Negative for syncope and light-headedness.    Allergies  Codeine; Erythromycin; and Percocet [oxycodone-acetaminophen]  Home Medications   Prior to Admission medications   Medication Sig Start Date End Date Taking? Authorizing Provider  Multiple Vitamins-Minerals (MULTIVITAMIN WITH MINERALS) tablet Take 1 tablet by mouth daily.   Yes [provider]  omeprazole (PRILOSEC) 20 MG capsule Take 1 capsule (20 mg total) by mouth daily. 04/18/12  Yes Floydene FlockNewton, Steven J, MD  amoxicillin-clavulanate (AUGMENTIN) 875-125 MG tablet Take 1 tablet by mouth 2 (two) times daily. One po bid x 7 days 12/20/16   Lurene ShadowPhelps, Uriyah Raska O, PA-C  predniSONE (DELTASONE) 20 MG tablet 3 tabs po day one, then 2 po daily x 4 days 12/20/16   Lurene ShadowPhelps, Juaquina Machnik O, PA-C   Meds Ordered and Administered this Visit  Medications - No data to display  BP 111/78 (BP Location: Left Arm)   Pulse 83   Temp 97.9 F (36.6 C) (Oral)   Resp 16   Ht 5\' 5"  (1.651 m)   Wt 151 lb (68.5 kg)   LMP 12/19/2016 (Exact Date)   SpO2 99%   BMI 25.13 kg/m  Orthostatic VS for the past 24 hrs:  BP- Lying Pulse- Lying BP- Sitting Pulse- Sitting BP- Standing at 0 minutes Pulse- Standing at 0 minutes  12/20/16 1853 105/68 84 117/74 85 118/83 92    Physical Exam  Constitutional: She is oriented to person, place, and time. She appears well-developed and well-nourished. No distress.  HENT:  Head: Normocephalic and atraumatic.  Right Ear: Tympanic membrane normal.  Left Ear: Tympanic membrane normal.  Nose: Mucosal edema present. Right sinus exhibits maxillary sinus tenderness and frontal sinus tenderness. Left sinus exhibits maxillary sinus tenderness and frontal sinus tenderness.  Mouth/Throat: Uvula is midline, oropharynx is clear and moist and mucous membranes are normal.  Eyes: Pupils are equal, round, and reactive to light. EOM  are normal. Right eye exhibits no discharge. Left eye exhibits no discharge. Right eye exhibits no nystagmus. Left eye exhibits no nystagmus.  Neck: Normal range of motion. Neck supple.  Cardiovascular: Normal rate and regular rhythm.   Pulmonary/Chest: Effort normal and breath sounds normal. No stridor. No respiratory distress. She has no wheezes. She has no rales.  Abdominal: Soft. She exhibits no distension and no mass. There is no tenderness. There is no rebound and no guarding.  Musculoskeletal: Normal range of motion.  Lymphadenopathy:    She has no cervical adenopathy.  Neurological: She is alert and oriented to person, place, and time. No cranial nerve deficit.  Skin: Skin is warm and dry. She is not diaphoretic.  Psychiatric: She has a normal mood and affect. Her behavior is normal.  Nursing note and vitals reviewed.   Urgent Care Course     Procedures (including critical care time)  Labs Review Labs Reviewed  POCT CBC W AUTO DIFF ([facility])    Imaging Review No results found.    MDM   1. Acute rhinosinusitis   2. Dizziness   3. Nausea without vomiting    Pt c/o sinus congestion, pressure, dizziness and nausea for 1-2 weeks.   CBC: unremarkable, no evidence of anemia. Normal orthostatic vitals.  Will cover for bacterial sinusitis.  Rx: Augmentin and prednisone Encouraged to stay well hydrated and get plenty of rest.  F/u with PCP in 1 week if not improving or establish care with a new PCP if unable to get into current PCP.   Lurene Shadowhelps, Soliana Kitko O, [place]PA-C 12/20/16 1935

## 2016-12-20 NOTE — ED Triage Notes (Signed)
[facility] complaint of Nasal Congestion, Cough and Nausea x 1 week

## 2016-12-22 ENCOUNTER — Telehealth: Payer: Self-pay | Admitting: Emergency Medicine

## 2016-12-22 NOTE — Telephone Encounter (Signed)
Patient states she still has cough, nausea and fatigue have subsided.

## 2016-12-24 NOTE — ED Notes (Signed)
Blood drawn from right antecubital Successful first try.

## 2017-04-21 DIAGNOSIS — N83291 Other ovarian cyst, right side: Secondary | ICD-10-CM | POA: Diagnosis not present

## 2017-04-21 DIAGNOSIS — N920 Excessive and frequent menstruation with regular cycle: Secondary | ICD-10-CM | POA: Diagnosis not present

## 2017-04-21 DIAGNOSIS — N83201 Unspecified ovarian cyst, right side: Secondary | ICD-10-CM | POA: Diagnosis not present

## 2017-10-23 DIAGNOSIS — N83201 Unspecified ovarian cyst, right side: Secondary | ICD-10-CM | POA: Diagnosis not present

## 2017-10-23 DIAGNOSIS — N920 Excessive and frequent menstruation with regular cycle: Secondary | ICD-10-CM | POA: Diagnosis not present

## 2017-10-23 DIAGNOSIS — Z01419 Encounter for gynecological examination (general) (routine) without abnormal findings: Secondary | ICD-10-CM | POA: Diagnosis not present

## 2017-10-23 DIAGNOSIS — Z1211 Encounter for screening for malignant neoplasm of colon: Secondary | ICD-10-CM | POA: Diagnosis not present

## 2017-10-23 DIAGNOSIS — Z124 Encounter for screening for malignant neoplasm of cervix: Secondary | ICD-10-CM | POA: Diagnosis not present

## 2017-11-03 DIAGNOSIS — Z1231 Encounter for screening mammogram for malignant neoplasm of breast: Secondary | ICD-10-CM | POA: Diagnosis not present

## 2018-01-12 DIAGNOSIS — Z1211 Encounter for screening for malignant neoplasm of colon: Secondary | ICD-10-CM | POA: Diagnosis not present

## 2018-05-13 DIAGNOSIS — G629 Polyneuropathy, unspecified: Secondary | ICD-10-CM | POA: Diagnosis not present

## 2019-01-15 ENCOUNTER — Emergency Department (INDEPENDENT_AMBULATORY_CARE_PROVIDER_SITE_OTHER)
Admission: EM | Admit: 2019-01-15 | Discharge: 2019-01-15 | Disposition: A | Discharge status: Home / Self Care | Payer: BC Managed Care – PPO | Source: Home / Self Care

## 2019-01-15 ENCOUNTER — Other Ambulatory Visit: Payer: Self-pay

## 2019-01-15 ENCOUNTER — Encounter: Payer: Self-pay | Admitting: Emergency Medicine

## 2019-01-15 ENCOUNTER — Emergency Department (INDEPENDENT_AMBULATORY_CARE_PROVIDER_SITE_OTHER): Payer: BC Managed Care – PPO

## 2019-01-15 DIAGNOSIS — M25512 Pain in left shoulder: Secondary | ICD-10-CM | POA: Diagnosis not present

## 2019-01-15 DIAGNOSIS — S4992XA Unspecified injury of left shoulder and upper arm, initial encounter: Secondary | ICD-10-CM | POA: Diagnosis not present

## 2019-01-15 MED ORDER — MELOXICAM 7.5 MG PO TABS: 7.5000 mg | ORAL_TABLET | Freq: Every day | ORAL | 0 refills | Status: DC

## 2019-01-15 NOTE — ED Provider Notes (Signed)
[facility]    CSN: [id number] Arrival date & time: 01/15/19  0830      History   Chief Complaint Chief Complaint  Patient presents with  . Shoulder Pain    HPI Jillian AgeeDonya Freas is a 53 y.o. female.   HPI Jillian Meza is a 53 y.o. female presenting to UC with c/o Left shoulder pain after falling on hardwood and landing on her Left elbow about 3 weeks ago. Pt heard a pop or a crack at the time but figured it was the floor. She did develop a bruise on her elbow but that has since resolved along with the elbow soreness. Pain is worse with certain movements of her shoulder, sharp at times, weak and sore other times.  She has tried ice and ROM exercises w/o relief.  She is Right hand dominant. No prior shoulder issues.    Past Medical History:  Diagnosis Date  . High cholesterol     There are no active problems to display for this patient.   Past Surgical History:  Procedure Laterality Date  . CESAREAN SECTION    . CHOLECYSTECTOMY      OB History   No obstetric history on file.      Home Medications    Prior to Admission medications   Medication Sig Start Date End Date Taking? Authorizing Provider  amoxicillin-clavulanate (AUGMENTIN) 875-125 MG tablet Take 1 tablet by mouth 2 (two) times daily. One po bid x 7 days 12/20/16   Lurene ShadowPhelps, Kathleena Freeman O, PA-C  meloxicam (MOBIC) 7.5 MG tablet Take 1-2 tablets (7.5-15 mg total) by mouth daily. 01/15/19   Lurene ShadowPhelps, Sheralee Qazi O, PA-C  Multiple Vitamins-Minerals (MULTIVITAMIN WITH MINERALS) tablet Take 1 tablet by mouth daily.    [provider]  omeprazole (PRILOSEC) 20 MG capsule Take 1 capsule (20 mg total) by mouth daily. 04/18/12   Floydene FlockNewton, Steven J, MD  predniSONE (DELTASONE) 20 MG tablet 3 tabs po day one, then 2 po daily x 4 days 12/20/16   Lurene ShadowPhelps, Shlok Raz O, PA-C    Family History Family History  Problem Relation Age of Onset  . Cancer Other        BREAST    Social History Social History   Tobacco Use  . Smoking  status: Never Smoker  . Smokeless tobacco: Never Used  Substance Use Topics  . Alcohol use: No  . Drug use: No     Allergies   Codeine, Erythromycin, and Percocet [oxycodone-acetaminophen]   Review of Systems Review of Systems  Musculoskeletal: Positive for arthralgias and myalgias. Negative for back pain, neck pain and neck stiffness.  Skin: Negative for color change and wound.  Neurological: Positive for weakness. Negative for numbness.     Physical Exam Triage Vital Signs ED Triage Vitals  Enc Vitals Group     BP 01/15/19 0854 121/83     Pulse Rate 01/15/19 0854 78     Resp 01/15/19 0854 16     Temp 01/15/19 0854 97.8 F (36.6 C)     Temp Source 01/15/19 0854 Oral     SpO2 01/15/19 0854 100 %     Weight 01/15/19 0856 140 lb (63.5 kg)     Height 01/15/19 0856 5\' 6"  (1.676 m)     Head Circumference --      Peak Flow --      Pain Score 01/15/19 0855 5     Pain Loc --      Pain Edu? --  Excl. in GC? --    No data found.  Updated Vital Signs BP 121/83 (BP Location: Right Arm)   Pulse 78   Temp 97.8 F (36.6 C) (Oral)   Resp 16   Ht 5\' 6"  (1.676 m)   Wt 140 lb (63.5 kg)   LMP 01/05/2019   SpO2 100%   BMI 22.60 kg/m   Visual Acuity Right Eye Distance:   Left Eye Distance:   Bilateral Distance:    Right Eye Near:   Left Eye Near:    Bilateral Near:     Physical Exam Vitals signs and nursing note reviewed.  Constitutional:      Appearance: Normal appearance. She is well-developed.  HENT:     Head: Normocephalic and atraumatic.  Neck:     Musculoskeletal: Normal range of motion and neck supple. No muscular tenderness.     Comments: No midline bone tenderness, no crepitus or step-offs. Cardiovascular:     Rate and Rhythm: Normal rate and regular rhythm.     Pulses:          Radial pulses are 2+ on the left side.  Pulmonary:     Effort: Pulmonary effort is normal.  Musculoskeletal: Normal range of motion.        General: Tenderness present.      Comments: Left shoulder: no deformity, mild tenderness to proximal deltoid and posterior aspect of shoulder. Full ROM with increased pain on end range. 4/5 strength against resistance.  Left elbow: non-tender, full ROM  Skin:    General: Skin is warm and dry.     Capillary Refill: Capillary refill takes less than 2 seconds.  Neurological:     Mental Status: She is alert and oriented to person, place, and time.  Psychiatric:        Behavior: Behavior normal.      UC Treatments / Results  Labs (all labs ordered are listed, but only abnormal results are displayed) Labs Reviewed - No data to display  EKG   Radiology Dg Shoulder Left  Result Date: 01/15/2019 CLINICAL DATA:  Fall, left shoulder pain EXAM: LEFT SHOULDER - 2+ VIEW COMPARISON:  None. FINDINGS: There is no evidence of fracture or dislocation. There is no evidence of arthropathy or other focal bone abnormality. Soft tissues are unremarkable. IMPRESSION: Negative. Electronically Signed   By: Rolm Baptise M.D.   On: 01/15/2019 09:26    Procedures Procedures (including critical care time)  Medications Ordered in UC Medications - No data to display  Initial Impression / Assessment and Plan / UC Course  I have reviewed the triage vital signs and the nursing notes.  Pertinent labs & imaging results that were available during my care of the patient were reviewed by me and considered in my medical decision making (see chart for details).     Hx and exam concerning for possible partial rotator cuff tear.  Meloxicam prescribed, pt hesitant to take any medications unless absolutely necessary.  AVS packet provided with more shoulder exercises, however, given duration of symptoms and failed reported home treatments, encouraged f/u with Sports Medicine next week for further evaluation and treatment.  Final Clinical Impressions(s) / UC Diagnoses   Final diagnoses:  Left shoulder pain     Discharge Instructions       Meloxicam (Mobic) is an antiinflammatory to help with pain and inflammation.  Do not take ibuprofen, Advil, Aleve, or any other medications that contain NSAIDs while taking meloxicam as this may cause stomach upset  or even ulcers if taken in large amounts for an extended period of time.   In this packet, there are several shoulder exercises you may try to see if they help with your pain.  If the exercises cause a lot more pain, please discontinue the exercises and follow up with Sports Medicine.  Please call to schedule a follow up appointment with Sports Medicine next week for further evaluation and treatment of Left shoulder pain.    ED Prescriptions    Medication Sig Dispense Auth. Provider   meloxicam (MOBIC) 7.5 MG tablet Take 1-2 tablets (7.5-15 mg total) by mouth daily. 15 tablet Lurene ShadowPhelps, Pape Parson O, PA-C     Controlled Substance Prescriptions [place] Controlled Substance Registry consulted? Not Applicable   Rolla Platehelps, Ellard Nan O, PA-C 01/15/19 [id number]

## 2019-01-15 NOTE — ED Triage Notes (Signed)
Patient fell and landed on left elbow about 3 weeks ago; has had residual pain in left shoulder since then that is positional and intermittent. No OTC today.

## 2019-01-15 NOTE — Discharge Instructions (Signed)
°  Meloxicam (Mobic) is an antiinflammatory to help with pain and inflammation.  Do not take ibuprofen, Advil, Aleve, or any other medications that contain NSAIDs while taking meloxicam as this may cause stomach upset or even ulcers if taken in large amounts for an extended period of time.   In this packet, there are several shoulder exercises you may try to see if they help with your pain.  If the exercises cause a lot more pain, please discontinue the exercises and follow up with Sports Medicine.  Please call to schedule a follow up appointment with Sports Medicine next week for further evaluation and treatment of Left shoulder pain.

## 2019-01-29 ENCOUNTER — Ambulatory Visit (INDEPENDENT_AMBULATORY_CARE_PROVIDER_SITE_OTHER): Payer: BC Managed Care – PPO | Admitting: Sports Medicine

## 2019-01-29 ENCOUNTER — Other Ambulatory Visit: Payer: Self-pay

## 2019-01-29 ENCOUNTER — Encounter: Payer: Self-pay | Admitting: Sports Medicine

## 2019-01-29 DIAGNOSIS — M75102 Unspecified rotator cuff tear or rupture of left shoulder, not specified as traumatic: Secondary | ICD-10-CM | POA: Insufficient documentation

## 2019-01-29 MED ORDER — MELOXICAM 15 MG PO TABS: ORAL_TABLET | ORAL | 3 refills | Status: DC

## 2019-01-29 NOTE — Progress Notes (Signed)
Subjective:    CC: Severe left shoulder pain  HPI:  This is a very pleasant 53 year old female, 5 weeks ago she fell onto her left shoulder, she has had severe pain since, localized anteriorly and posteriorly, over the deltoid with radiation to the elbow, worse with external rotation and abduction.  She was seen in urgent care recently, x-rays were for the most part negative, she is here for further evaluation definitive treatment.  I reviewed the past medical history, family history, social history, surgical history, and allergies today and no changes were needed.  Please see the problem list section below in epic for further details.  Past Medical History: Past Medical History:  Diagnosis Date  . High cholesterol    Past Surgical History: Past Surgical History:  Procedure Laterality Date  . CESAREAN SECTION    . CHOLECYSTECTOMY     Social History: Social History   Socioeconomic History  . Marital status: Married    Spouse name: Not on file  . Number of children: Not on file  . Years of education: Not on file  . Highest education level: Not on file  Occupational History  . Not on file  Social Needs  . Financial resource strain: Not on file  . Food insecurity    Worry: Not on file    Inability: Not on file  . Transportation needs    Medical: Not on file    Non-medical: Not on file  Tobacco Use  . Smoking status: Never Smoker  . Smokeless tobacco: Never Used  Substance and Sexual Activity  . Alcohol use: No  . Drug use: No  . Sexual activity: Not on file  Lifestyle  . Physical activity    Days per week: Not on file    Minutes per session: Not on file  . Stress: Not on file  Relationships  . Social Musicianconnections    Talks on phone: Not on file    Gets together: Not on file    Attends religious service: Not on file    Active member of club or organization: Not on file    Attends meetings of clubs or organizations: Not on file    Relationship status: Not on file   Other Topics Concern  . Not on file  Social History Narrative  . Not on file   Family History: Family History  Problem Relation Age of Onset  . Cancer Other        BREAST   Allergies: Allergies  Allergen Reactions  . Codeine Nausea Only  . Erythromycin Nausea Only  . Percocet [Oxycodone-Acetaminophen]    Medications: See med rec.  Review of Systems: No headache, visual changes, nausea, vomiting, diarrhea, constipation, dizziness, abdominal pain, skin rash, fevers, chills, night sweats, swollen lymph nodes, weight loss, chest pain, body aches, joint swelling, muscle aches, shortness of breath, mood changes, visual or auditory hallucinations.  Objective:    General: Well Developed, well nourished, and in no acute distress.  Neuro: Alert and oriented x3, extra-ocular muscles intact, sensation grossly intact.  HEENT: Normocephalic, atraumatic, pupils equal round reactive to light, neck supple, no masses, no lymphadenopathy, thyroid nonpalpable.  Skin: Warm and dry, no rashes noted.  Cardiac: Regular rate and rhythm, no murmurs rubs or gallops.  Respiratory: Clear to auscultation bilaterally. Not using accessory muscles, speaking in full sentences.  Abdominal: Soft, nontender, nondistended, positive bowel sounds, no masses, no organomegaly.  Left shoulder: Inspection reveals no abnormalities, atrophy or asymmetry. Palpation is normal with no tenderness  over [employer] joint or bicipital groove. ROM is very weak to abduction. Rotator cuff strength normal throughout. Positive Neer and Hawkin's tests, empty can. Speeds and Yergason's tests normal. No labral pathology noted with negative Obrien's, negative crank, negative clunk, and good stability. Normal scapular function observed. Positive painful arc and drop arm sign. No apprehension sign  Procedure: Diagnostic Ultrasound of left shoulder Device: GE Logiq E  Findings: I imaged the supraspinatus, infraspinatus, subscapularis, teres  minor, acromioclavicular joint, long head of the biceps in long and short axes, all looked overall unremarkable. Images permanently stored and available for review in the ultrasound unit.  Impression: No ultrasound evidence of rotator cuff tear, joint effusion, acromioclavicular arthritis.  Impression and Recommendations:    The patient was counselled, risk factors were discussed, anticipatory guidance given.  Rotator cuff syndrome, left I think that this pleasant 53 year old female has an acute supraspinatus strain after a fall 5 weeks ago. She is quite weak to abduction. X-rays unremarkable, adding meloxicam, MRI due to weakness. Sling. Return to see me to go over MRI results.   ___________________________________________ Gwen Her. Dianah Field, M.D., ABFM., CAQSM. Primary Care and Sports Medicine [facility] [facility]  Adjunct Professor of [place] [employer] [facility] [employer]

## 2019-01-29 NOTE — Assessment & Plan Note (Addendum)
I think that this pleasant 53 year old female has an acute supraspinatus strain after a fall 5 weeks ago. She is quite weak to abduction. X-rays unremarkable, adding meloxicam, MRI due to weakness. Sling. Return to see me to go over MRI results.

## 2019-02-07 ENCOUNTER — Other Ambulatory Visit: Payer: Self-pay

## 2019-02-07 ENCOUNTER — Ambulatory Visit (INDEPENDENT_AMBULATORY_CARE_PROVIDER_SITE_OTHER): Payer: BC Managed Care – PPO

## 2019-02-07 DIAGNOSIS — M75102 Unspecified rotator cuff tear or rupture of left shoulder, not specified as traumatic: Secondary | ICD-10-CM | POA: Diagnosis not present

## 2019-02-07 DIAGNOSIS — M67814 Other specified disorders of tendon, left shoulder: Secondary | ICD-10-CM | POA: Diagnosis not present

## 2019-02-12 ENCOUNTER — Ambulatory Visit (INDEPENDENT_AMBULATORY_CARE_PROVIDER_SITE_OTHER): Payer: BC Managed Care – PPO | Admitting: Sports Medicine

## 2019-02-12 ENCOUNTER — Other Ambulatory Visit: Payer: Self-pay

## 2019-02-12 ENCOUNTER — Encounter: Payer: Self-pay | Admitting: Sports Medicine

## 2019-02-12 DIAGNOSIS — M75102 Unspecified rotator cuff tear or rupture of left shoulder, not specified as traumatic: Secondary | ICD-10-CM

## 2019-02-12 NOTE — Assessment & Plan Note (Signed)
MRI shows findings consistent with adhesive capsulitis and shoulder bursitis. Glenohumeral and subacromial injections performed today. Return to see me in a month. Rehab exercises given.

## 2019-02-12 NOTE — Progress Notes (Signed)
Subjective:    CC: Left shoulder pain  HPI: This is a very pleasant 53 year old female, she has had severe left shoulder pain for some time now, on exam she had significant rotator cuff weakness we obtained an MRI, the results of which will be dictated below.  Pain is severe, persistent, localized around the anterior and posterior joint line as well as over the deltoid with radiation down into the mid humerus but not past the elbow.  I reviewed the past medical history, family history, social history, surgical history, and allergies today and no changes were needed.  Please see the problem list section below in epic for further details.  Past Medical History: Past Medical History:  Diagnosis Date  . High cholesterol    Past Surgical History: Past Surgical History:  Procedure Laterality Date  . CESAREAN SECTION    . CHOLECYSTECTOMY     Social History: Social History   Socioeconomic History  . Marital status: Married    Spouse name: Not on file  . Number of children: Not on file  . Years of education: Not on file  . Highest education level: Not on file  Occupational History  . Not on file  Social Needs  . Financial resource strain: Not on file  . Food insecurity    Worry: Not on file    Inability: Not on file  . Transportation needs    Medical: Not on file    Non-medical: Not on file  Tobacco Use  . Smoking status: Never Smoker  . Smokeless tobacco: Never Used  Substance and Sexual Activity  . Alcohol use: No  . Drug use: No  . Sexual activity: Not on file  Lifestyle  . Physical activity    Days per week: Not on file    Minutes per session: Not on file  . Stress: Not on file  Relationships  . Social Herbalist on phone: Not on file    Gets together: Not on file    Attends religious service: Not on file    Active member of club or organization: Not on file    Attends meetings of clubs or organizations: Not on file    Relationship status: Not on file   Other Topics Concern  . Not on file  Social History Narrative  . Not on file   Family History: Family History  Problem Relation Age of Onset  . Cancer Other        BREAST   Allergies: Allergies  Allergen Reactions  . Codeine Nausea Only  . Erythromycin Nausea Only  . Percocet [Oxycodone-Acetaminophen]    Medications: See med rec.  Review of Systems: No fevers, chills, night sweats, weight loss, chest pain, or shortness of breath.   Objective:    General: Well Developed, well nourished, and in no acute distress.  Neuro: Alert and oriented x3, extra-ocular muscles intact, sensation grossly intact.  HEENT: Normocephalic, atraumatic, pupils equal round reactive to light, neck supple, no masses, no lymphadenopathy, thyroid nonpalpable.  Skin: Warm and dry, no rashes. Cardiac: Regular rate and rhythm, no murmurs rubs or gallops, no lower extremity edema.  Respiratory: Clear to auscultation bilaterally. Not using accessory muscles, speaking in full sentences. Left shoulder: External rotation to about 20 degrees, abduction to 35 degrees, all with severe pain.  MRI shows a subacromial bursitis as well as thickening of the capsule/IGHL consistent with adhesive capsulitis  Procedure: Real-time Ultrasound Guided injection of the left glenohumeral joint Device: GE Logiq  E  Verbal informed consent obtained.  Time-out conducted.  Noted no overlying erythema, induration, or other signs of local infection.  Skin prepped in a sterile fashion.  Local anesthesia: Topical Ethyl chloride.  With sterile technique and under real time ultrasound guidance:  1 cc Kenalog 40, 2 cc lidocaine, 2 cc bupivacaine injected easily from a posterior approach with a 22-gauge spinal needle Completed without difficulty  Pain immediately resolved suggesting accurate placement of the medication.  Advised to call if fevers/chills, erythema, induration, drainage, or persistent bleeding.  Images permanently stored  and available for review in the ultrasound unit.  Impression: Technically successful ultrasound guided injection.  Procedure: Real-time Ultrasound Guided injection of the left subacromial bursa Device: GE Logiq E  Verbal informed consent obtained.  Time-out conducted.  Noted no overlying erythema, induration, or other signs of local infection.  Skin prepped in a sterile fashion.  Local anesthesia: Topical Ethyl chloride.  With sterile technique and under real time ultrasound guidance:  1 cc Kenalog 40, 1 cc lidocaine, 1 cc bupivacaine injected easily Completed without difficulty  Pain immediately resolved suggesting accurate placement of the medication.  Advised to call if fevers/chills, erythema, induration, drainage, or persistent bleeding.  Images permanently stored and available for review in the ultrasound unit.  Impression: Technically successful ultrasound guided injection.  Impression and Recommendations:    Rotator cuff syndrome, left MRI shows findings consistent with adhesive capsulitis and shoulder bursitis. Glenohumeral and subacromial injections performed today. Return to see me in a month. Rehab exercises given.   ___________________________________________ Ihor Austinhomas J. Benjamin Stainhekkekandam, M.D., ABFM., CAQSM. Primary Care and Sports Medicine [facility]  Adjunct Professor of Family Medicine  [employer]

## 2019-03-15 ENCOUNTER — Ambulatory Visit (INDEPENDENT_AMBULATORY_CARE_PROVIDER_SITE_OTHER): Payer: BC Managed Care – PPO | Admitting: Sports Medicine

## 2019-03-15 ENCOUNTER — Other Ambulatory Visit: Payer: Self-pay

## 2019-03-15 ENCOUNTER — Encounter: Payer: Self-pay | Admitting: Sports Medicine

## 2019-03-15 DIAGNOSIS — M75102 Unspecified rotator cuff tear or rupture of left shoulder, not specified as traumatic: Secondary | ICD-10-CM

## 2019-03-15 NOTE — Assessment & Plan Note (Signed)
MRI showed evidence of rotator cuff tendinopathy as well as frozen shoulder. We did subacromial and glenohumeral injections at the last visit, she returns today 85% better and continuing to improve. Advancing to more advanced rehabilitation, she is agreeable with an open-ended follow-up and feels as though she can live with the shoulder now.

## 2019-03-15 NOTE — Progress Notes (Signed)
Subjective:    CC: Follow-up  HPI: Jillian Meza returns, she is a pleasant 53 year old female with left shoulder pain, we did a glenohumeral and subacromial injections the last visit after her MRI showed changes consistent with rotator cuff tendinosis and adhesive capsulitis.  She returns today 85% better and continuing to improve.  I reviewed the past medical history, family history, social history, surgical history, and allergies today and no changes were needed.  Please see the problem list section below in epic for further details.  Past Medical History: Past Medical History:  Diagnosis Date  . High cholesterol    Past Surgical History: Past Surgical History:  Procedure Laterality Date  . CESAREAN SECTION    . CHOLECYSTECTOMY     Social History: Social History   Socioeconomic History  . Marital status: Married    Spouse name: Not on file  . Number of children: Not on file  . Years of education: Not on file  . Highest education level: Not on file  Occupational History  . Not on file  Social Needs  . Financial resource strain: Not on file  . Food insecurity    Worry: Not on file    Inability: Not on file  . Transportation needs    Medical: Not on file    Non-medical: Not on file  Tobacco Use  . Smoking status: Never Smoker  . Smokeless tobacco: Never Used  Substance and Sexual Activity  . Alcohol use: No  . Drug use: No  . Sexual activity: Not on file  Lifestyle  . Physical activity    Days per week: Not on file    Minutes per session: Not on file  . Stress: Not on file  Relationships  . Social Herbalist on phone: Not on file    Gets together: Not on file    Attends religious service: Not on file    Active member of club or organization: Not on file    Attends meetings of clubs or organizations: Not on file    Relationship status: Not on file  Other Topics Concern  . Not on file  Social History Narrative  . Not on file   Family History: Family  History  Problem Relation Age of Onset  . Cancer Other        BREAST   Allergies: Allergies  Allergen Reactions  . Codeine Nausea Only  . Erythromycin Nausea Only  . Percocet [Oxycodone-Acetaminophen]    Medications: See med rec.  Review of Systems: No fevers, chills, night sweats, weight loss, chest pain, or shortness of breath.   Objective:    General: Well Developed, well nourished, and in no acute distress.  Neuro: Alert and oriented x3, extra-ocular muscles intact, sensation grossly intact.  HEENT: Normocephalic, atraumatic, pupils equal round reactive to light, neck supple, no masses, no lymphadenopathy, thyroid nonpalpable.  Skin: Warm and dry, no rashes. Cardiac: Regular rate and rhythm, no murmurs rubs or gallops, no lower extremity edema.  Respiratory: Clear to auscultation bilaterally. Not using accessory muscles, speaking in full sentences.  Impression and Recommendations:    Rotator cuff syndrome, left MRI showed evidence of rotator cuff tendinopathy as well as frozen shoulder. We did subacromial and glenohumeral injections at the last visit, she returns today 85% better and continuing to improve. Advancing to more advanced rehabilitation, she is agreeable with an open-ended follow-up and feels as though she can live with the shoulder now.   ___________________________________________ Gwen Her. Dianah Field, M.D., ABFM.,  CAQSM. Primary Care and Sports Medicine [facility] [place] [facility]  Adjunct Professor of [employer]

## 2019-04-12 DIAGNOSIS — R634 Abnormal weight loss: Secondary | ICD-10-CM | POA: Diagnosis not present

## 2019-04-12 DIAGNOSIS — E785 Hyperlipidemia, unspecified: Secondary | ICD-10-CM | POA: Diagnosis not present

## 2019-04-12 DIAGNOSIS — N6320 Unspecified lump in the left breast, unspecified quadrant: Secondary | ICD-10-CM | POA: Diagnosis not present

## 2019-04-21 DIAGNOSIS — N6320 Unspecified lump in the left breast, unspecified quadrant: Secondary | ICD-10-CM | POA: Diagnosis not present

## 2019-04-21 DIAGNOSIS — N6012 Diffuse cystic mastopathy of left breast: Secondary | ICD-10-CM | POA: Diagnosis not present

## 2019-04-29 DIAGNOSIS — N6012 Diffuse cystic mastopathy of left breast: Secondary | ICD-10-CM | POA: Diagnosis not present

## 2019-04-29 DIAGNOSIS — N6002 Solitary cyst of left breast: Secondary | ICD-10-CM | POA: Diagnosis not present

## 2019-05-05 ENCOUNTER — Emergency Department (INDEPENDENT_AMBULATORY_CARE_PROVIDER_SITE_OTHER)
Admission: EM | Admit: 2019-05-05 | Discharge: 2019-05-05 | Disposition: A | Discharge status: Home / Self Care | Payer: BC Managed Care – PPO | Source: Home / Self Care | Attending: Family Medicine | Admitting: Family Medicine

## 2019-05-05 ENCOUNTER — Other Ambulatory Visit: Payer: Self-pay

## 2019-05-05 DIAGNOSIS — Z20828 Contact with and (suspected) exposure to other viral communicable diseases: Secondary | ICD-10-CM | POA: Diagnosis not present

## 2019-05-05 DIAGNOSIS — Z20822 Contact with and (suspected) exposure to covid-19: Secondary | ICD-10-CM

## 2019-05-05 NOTE — ED Provider Notes (Signed)
[facility]    CSN: [id number] Arrival date & time: 05/05/19  1022      History   Chief Complaint Chief Complaint  Patient presents with  . COVID testing    HPI Jillian Meza is a 53 y.o. female.   Patient presents for COVID19 testing.  Her husband tested positive on 04/30/19.  She is completely assymptomatic at present.  The history is provided by the patient.    Past Medical History:  Diagnosis Date  . High cholesterol     Patient Active Problem List   Diagnosis Date Noted  . Rotator cuff syndrome, left 01/29/2019    Past Surgical History:  Procedure Laterality Date  . CESAREAN SECTION    . CHOLECYSTECTOMY      OB History   No obstetric history on file.      Home Medications    Prior to Admission medications   Medication Sig Start Date End Date Taking? Authorizing Provider  meloxicam (MOBIC) 15 MG tablet One tab PO qAM with breakfast for 2 weeks, then daily prn pain. 01/29/19   Silverio Decamp, MD    Family History Family History  Problem Relation Age of Onset  . Cancer Other        BREAST    Social History Social History   Tobacco Use  . Smoking status: Never Smoker  . Smokeless tobacco: Never Used  Substance Use Topics  . Alcohol use: No  . Drug use: No     Allergies   Codeine, Erythromycin, and Percocet [oxycodone-acetaminophen]   Review of Systems Review of Systems No sore throat No cough No pleuritic pain No wheezing No nasal congestion No post-nasal drainage No sinus pain/pressure No itchy/red eyes No earache No hemoptysis No SOB No fever/chills No nausea No vomiting No abdominal pain No diarrhea No urinary symptoms No skin rash No fatigue No myalgias No headache   Physical Exam Triage Vital Signs ED Triage Vitals  Enc Vitals Group     BP 05/05/19 1055 116/76     Pulse Rate 05/05/19 1055 64     Resp 05/05/19 1055 18     Temp 05/05/19 1055 98.3 F (36.8 C)     Temp Source 05/05/19 1055  Oral     SpO2 05/05/19 1055 99 %     Weight 05/05/19 1056 134 lb 7.7 oz (61 kg)     Height 05/05/19 1056 '5\' 6"'  (1.676 m)     Head Circumference --      Peak Flow --      Pain Score 05/05/19 1056 0     Pain Loc --      Pain Edu? --      Excl. in [place]? --    No data found.  Updated Vital Signs BP 116/76 (BP Location: Left Arm)   Pulse 64   Temp 98.3 F (36.8 C) (Oral)   Resp 18   Ht '5\' 6"'  (1.676 m)   Wt 61 kg   LMP  (LMP Unknown)   SpO2 99%   BMI 21.71 kg/m   Visual Acuity Right Eye Distance:   Left Eye Distance:   Bilateral Distance:    Right Eye Near:   Left Eye Near:    Bilateral Near:     Physical Exam Vitals signs and nursing note reviewed.  Constitutional:      General: She is not in acute distress. Eyes:     Pupils: Pupils are equal, round, and reactive to light.  Cardiovascular:  Rate and Rhythm: Normal rate.  Pulmonary:     Effort: Pulmonary effort is normal.  Neurological:     Mental Status: She is alert.   Patient not examined otherwise.   UC Treatments / Results  Labs (all labs ordered are listed, but only abnormal results are displayed) Labs Reviewed  SARS-COV-2 RNA,(COVID-19) QUALITATIVE NAAT    EKG   Radiology No results found.  Procedures Procedures (including critical care time)  Medications Ordered in UC Medications - No data to display  Initial Impression / Assessment and Plan / UC Course  I have reviewed the triage vital signs and the nursing notes.  Pertinent labs & imaging results that were available during my care of the patient were reviewed by me and considered in my medical decision making (see chart for details).    Patient assymptomatic at present.  COVID19 send out   Final Clinical Impressions(s) / UC Diagnoses   Final diagnoses:  Close exposure to COVID-19 virus     Discharge Instructions     Isolate yourself until COVID-19 test result is available.   If COVID-19 test is positive, isolate yourself  until the below conditions are met: 1)  At least 7 days since symptoms onset. AND 2)  > 72 hours after symptom resolution (absence of fever without the use of fever-reducing medicine, and improvement in respiratory symptoms.        ED Prescriptions    None        Kandra Nicolas, MD 05/05/19 1110

## 2019-05-05 NOTE — Discharge Instructions (Addendum)
Isolate yourself until COVID-19 test result is available.  ° °If COVID-19 test is positive, isolate yourself until the below conditions are met: °1)  At least 7 days since symptoms onset. °AND °2)  > 72 hours after symptom resolution (absence of fever without the use of fever-reducing medicine, and improvement in respiratory symptoms. ° °   °

## 2019-05-05 NOTE — ED Triage Notes (Signed)
Pts husband tested pos 12/4. Currently asymptomatic.

## 2019-05-08 LAB — SARS-COV-2 RNA,(COVID-19) QUALITATIVE NAAT: SARS CoV2 RNA: NOT DETECTED

## 2019-06-09 ENCOUNTER — Emergency Department (INDEPENDENT_AMBULATORY_CARE_PROVIDER_SITE_OTHER)
Admission: EM | Admit: 2019-06-09 | Discharge: 2019-06-09 | Disposition: A | Discharge status: Home / Self Care | Payer: BC Managed Care – PPO | Source: Home / Self Care

## 2019-06-09 ENCOUNTER — Other Ambulatory Visit: Payer: Self-pay

## 2019-06-09 DIAGNOSIS — Z20822 Contact with and (suspected) exposure to covid-19: Secondary | ICD-10-CM | POA: Diagnosis not present

## 2019-06-09 DIAGNOSIS — R519 Headache, unspecified: Secondary | ICD-10-CM

## 2019-06-09 DIAGNOSIS — B349 Viral infection, unspecified: Secondary | ICD-10-CM | POA: Diagnosis not present

## 2019-06-09 MED ORDER — DEXAMETHASONE 4 MG PO TABS: 4.0000 mg | ORAL_TABLET | Freq: Two times a day (BID) | ORAL | 0 refills | Status: DC

## 2019-06-09 NOTE — ED Provider Notes (Signed)
[facility]    CSN: [id number] Arrival date & time: 06/09/19  1134      History   Chief Complaint Chief Complaint  Patient presents with  . Headache  . Diarrhea  . Weakness    HPI Jillian Meza is a 54 y.o. female.   [facility]ished [facility] patient here for evaluation of headache, fatigue  Pt started with a headache a week ago.  Over the weekend, had a migraine.  Has had nasal drainage and fatigue.  This is associated with chills and diarrhea.  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  Still has smell sense and no cough.  No known fever.  No h/o migraine or chronic diarrhea.  No diabetes, hypertension or obesity.     Past Medical History:  Diagnosis Date  . High cholesterol     Patient Active Problem List   Diagnosis Date Noted  . Rotator cuff syndrome, left 01/29/2019    Past Surgical History:  Procedure Laterality Date  . CESAREAN SECTION    . CHOLECYSTECTOMY      OB History   No obstetric history on file.      Home Medications    Prior to Admission medications   Medication Sig Start Date End Date Taking? Authorizing Provider  dexamethasone (DECADRON) 4 MG tablet Take 1 tablet (4 mg total) by mouth 2 (two) times daily with a meal. 06/09/19   Elvina Sidle, MD  meloxicam (MOBIC) 15 MG tablet One tab PO qAM with breakfast for 2 weeks, then daily prn pain. 01/29/19   Monica Becton, MD    Family History Family History  Problem Relation Age of Onset  . Cancer Other        BREAST    Social History Social History   Tobacco Use  . Smoking status: Never Smoker  . Smokeless tobacco: Never Used  Substance Use Topics  . Alcohol use: No  . Drug use: No     Allergies   Codeine, Erythromycin, and Percocet [oxycodone-acetaminophen]   Review of Systems Review of Systems  Constitutional: Positive for chills, diaphoresis and fatigue.  HENT: Positive for congestion.   Respiratory:  Negative for cough and shortness of breath.   Cardiovascular: Negative for chest pain.  Gastrointestinal: Positive for diarrhea.  Musculoskeletal: Positive for myalgias.  All other systems reviewed and are negative.    Physical Exam Triage Vital Signs ED Triage Vitals  Enc Vitals Group     BP 06/09/19 1154 101/69     Pulse Rate 06/09/19 1154 94     Resp 06/09/19 1154 20     Temp 06/09/19 1154 97.8 F (36.6 C)     Temp Source 06/09/19 1154 Oral     SpO2 06/09/19 1154 100 %     Weight 06/09/19 1158 135 lb (61.2 kg)     Height 06/09/19 1158 5\' 6"  (1.676 m)     Head Circumference --      Peak Flow --      Pain Score 06/09/19 1158 0     Pain Loc --      Pain Edu? --      Excl. in [place]? --    No data found.  Updated Vital Signs BP 101/69 (BP Location: Right Arm)   Pulse 94   Temp 97.8 F (36.6 C) (Oral)   Resp 20   Ht 5\' 6"  (1.676 m)   Wt 61.2 kg  SpO2 100%   BMI 21.79 kg/m    Physical Exam Vitals and nursing note reviewed.  Constitutional:      General: She is not in acute distress.    Appearance: She is well-developed and normal weight. She is not ill-appearing.  HENT:     Head: Normocephalic.  Eyes:     Extraocular Movements: Extraocular movements intact.  Cardiovascular:     Rate and Rhythm: Normal rate.  Pulmonary:     Effort: Pulmonary effort is normal.  Musculoskeletal:        General: Normal range of motion.     Cervical back: Normal range of motion and neck supple.  Skin:    General: Skin is warm and dry.  Neurological:     Mental Status: She is alert.     Cranial Nerves: No cranial nerve deficit.     Sensory: No sensory deficit.      UC Treatments / Results  Labs (all labs ordered are listed, but only abnormal results are displayed) Labs Reviewed  NOVEL CORONAVIRUS, NAA    EKG   Radiology No results found.  Procedures Procedures (including critical care time)  Medications Ordered in UC Medications - No data to display  Initial  Impression / Assessment and Plan / UC Course  I have reviewed the triage vital signs and the nursing notes.  Pertinent labs & imaging results that were available during my care of the patient were reviewed by me and considered in my medical decision making (see chart for details).    Final Clinical Impressions(s) / UC Diagnoses   Final diagnoses:  Nonintractable headache, unspecified chronicity pattern, unspecified headache type  Viral syndrome  Exposure to COVID-19 virus     Discharge Instructions     Strategies to prevent and/or treat COVID-19:  Vitamin D3 5000 IU (125 mcg) daily Vitamin C 500 mg twice daily Zinc 50 to 75 mg daily  Listerine type mouthwash 4 times a day        ED Prescriptions    Medication Sig Dispense Auth. Provider   dexamethasone (DECADRON) 4 MG tablet Take 1 tablet (4 mg total) by mouth 2 (two) times daily with a meal. 4 tablet Robyn Haber, MD     I have reviewed the PDMP during this encounter.   Robyn Haber, MD 06/09/19 1219

## 2019-06-09 NOTE — ED Triage Notes (Signed)
Pt started with a headache a week ago.  Over the weekend, had a migraine.  Has had nasal drainage and fatigue.

## 2019-06-09 NOTE — Discharge Instructions (Signed)
Strategies to prevent and/or treat COVID-19:  Vitamin D3 5000 IU (125 mcg) daily Vitamin C 500 mg twice daily Zinc 50 to 75 mg daily  Listerine type mouthwash 4 times a day    

## 2019-06-10 LAB — NOVEL CORONAVIRUS, NAA: SARS-CoV-2, NAA: NOT DETECTED

## 2019-06-14 ENCOUNTER — Telehealth: Payer: Self-pay

## 2019-06-14 ENCOUNTER — Emergency Department (INDEPENDENT_AMBULATORY_CARE_PROVIDER_SITE_OTHER)
Admission: EM | Admit: 2019-06-14 | Discharge: 2019-06-14 | Disposition: A | Discharge status: Home / Self Care | Payer: BC Managed Care – PPO | Source: Home / Self Care | Attending: Emergency Medicine | Admitting: Emergency Medicine

## 2019-06-14 ENCOUNTER — Other Ambulatory Visit: Payer: Self-pay

## 2019-06-14 DIAGNOSIS — R0981 Nasal congestion: Secondary | ICD-10-CM | POA: Diagnosis not present

## 2019-06-14 DIAGNOSIS — B349 Viral infection, unspecified: Secondary | ICD-10-CM

## 2019-06-14 LAB — POCT INFLUENZA A/B
Influenza A, POC: NEGATIVE
Influenza B, POC: NEGATIVE

## 2019-06-14 MED ORDER — ONDANSETRON 8 MG PO TBDP: ORAL_TABLET | ORAL | 0 refills | Status: DC

## 2019-06-14 MED ORDER — ALBUTEROL SULFATE HFA 108 (90 BASE) MCG/ACT IN AERS: 1.0000 | INHALATION_SPRAY | RESPIRATORY_TRACT | 0 refills | Status: DC | PRN

## 2019-06-14 MED ORDER — AEROCHAMBER PLUS MISC: 2 refills | Status: DC

## 2019-06-14 MED ORDER — IBUPROFEN 600 MG PO TABS: 600.0000 mg | ORAL_TABLET | Freq: Four times a day (QID) | ORAL | 0 refills | Status: DC | PRN

## 2019-06-14 NOTE — ED Triage Notes (Signed)
Pt is not feeling any better.  Has had dizziness.  Used husband's inhaler over the weekend.  Feels fatigued and loopy.

## 2019-06-14 NOTE — Discharge Instructions (Addendum)
We will contact you if your flu is positive and will call in Tamiflu.  Otherwise, ibuprofen 600 mg combined with 1 g of Tylenol together 3-4 times a day as needed for headache, body ache.  2 puffs from your albuterol inhaler using your spacer every 4-6 hours as needed.  Zofran for nausea.  Continue pushing fluids and resting.

## 2019-06-14 NOTE — Telephone Encounter (Signed)
Pt called and said she is still not feeling well.  She said she is still weak.  I explained that she could follow up here, or go to the ER and be checked out.

## 2019-06-14 NOTE — ED Provider Notes (Signed)
HPI  SUBJECTIVE:  Jillian Meza is a 54 y.o. female who presents with persistent headaches, dizziness described as feeling "off balance", fatigue, nausea with eating, chest tightness.  She reports diarrhea yesterday.  She reports body aches, postnasal drip. cough starting today.  She states her chest has felt tight for the past several days and has been using her husband's albuterol with improvement.  No fevers, nasal congestion, sore throat, loss of sense of smell or taste.  No shortness of breath.  No vomiting, ear pain.  No recent known exposure to Covid.  Husband had it back in December.  Reports sinus pain and pressure last week, this has resolved.  She has been taking 1000 mg of Tylenol, Advil Cold and Sinus, albuterol, and took dexamethasone for 2 days.  The albuterol helps.  Symptoms are worse with activity.  No history of diabetes, hypertension, coronary disease, chronic kidney disease, pulmonary disease, smoking, HIV, cancer, immunocompromise.  TSV:[id number], Tiffany, NP   Patient was seen here 5 days ago with headache diarrhea weakness.  Covid negative.  Was thought to have a viral syndrome and a non-intractable headache.  Sent home with dexamethasone 4 mg twice daily for 2 days.  Past Medical History:  Diagnosis Date  . High cholesterol     Past Surgical History:  Procedure Laterality Date  . CESAREAN SECTION    . CHOLECYSTECTOMY      Family History  Problem Relation Age of Onset  . Cancer Other        BREAST    Social History   Tobacco Use  . Smoking status: Never Smoker  . Smokeless tobacco: Never Used  Substance Use Topics  . Alcohol use: No  . Drug use: No    No current facility-administered medications for this encounter.  Current Outpatient Medications:  .  albuterol (VENTOLIN HFA) 108 (90 Base) MCG/ACT inhaler, Inhale 1-2 puffs into the lungs every 4 (four) hours as needed for wheezing or shortness of breath., Disp: 18 g, Rfl: 0 .  ibuprofen (ADVIL) 600 MG  tablet, Take 1 tablet (600 mg total) by mouth every 6 (six) hours as needed., Disp: 30 tablet, Rfl: 0 .  ondansetron (ZOFRAN ODT) 8 MG disintegrating tablet, 1/2- 1 tablet q 8 hr prn nausea, vomiting, Disp: 20 tablet, Rfl: 0 .  Spacer/Aero-Holding Chambers (AEROCHAMBER PLUS) inhaler, Use as instructed, Disp: 1 each, Rfl: 2  Allergies  Allergen Reactions  . Codeine Nausea Only  . Erythromycin Nausea Only  . Percocet [Oxycodone-Acetaminophen]      ROS  As noted in HPI.   Physical Exam  BP (!) 146/73 (BP Location: Left Arm)   Pulse 94   Temp 98.2 F (36.8 C) (Oral)   Resp 20   Ht 5\' 6"  (1.676 m)   Wt 61.2 kg   SpO2 100%   BMI 21.79 kg/m   Constitutional: Well developed, well nourished, no acute distress.  Appears ill. Eyes: PERRL, EOMI, conjunctiva normal bilaterally HENT: Normocephalic, atraumatic,mucus membranes moist left-sided nasal congestion.  Erythematous, swollen turbinates.  No maxillary frontal sinus tenderness.  Normal tonsils, no exudates, uvula midline.  No obvious postnasal drip. Neck: No cervical lymphadenopathy, meningismus Respiratory: Clear to auscultation bilaterally, no rales, no wheezing, no rhonchi Cardiovascular: Normal rate and rhythm, no murmurs, no gallops, no rubs GI: Nondistended skin: No rash, Musculoskeletal: no deformities Neurologic: Alert & oriented x 3, CN III-XII grossly intact, no motor deficits, sensation grossly intact Psychiatric: Speech and behavior appropriate   ED Course   Medications -  No data to display  Orders Placed This Encounter  Procedures  . Novel Coronavirus, NAA ([facility])    Standing Status:   Standing    Number of Occurrences:   1  . POCT Influenza A/B    Standing Status:   Standing    Number of Occurrences:   1   Results for orders placed or performed during the hospital encounter of 06/14/19 (from the past 24 hour(s))  POCT Influenza A/B     Status: None   Collection Time: 06/14/19  7:35 PM  Result Value  Ref Range   Influenza A, POC Negative Negative   Influenza B, POC Negative Negative   No results found.  ED Clinical Impression  1. Nasal congestion   2. Viral illness      ED Assessment/Plan   Previous records reviewed.  As noted in HPI.  Repeating Covid PCR.  May have been tested too early.  Also checking flu.  Will call patient at [phone] if positive and call Tamiflu.    Presentation consistent with viral illness.  Does not appear to be a sinusitis, pneumonia at this point in time.  Patient not hypoxic, tachycardic.  Will send home with ibuprofen 600 mg combined with 1 g of Tylenol 3-4 times a day as needed, albuterol inhaler with a spacer, Zofran.  Push electrolyte containing fluids.  Follow-up with PMD in several days.  Flu negative.  Discussed labs  MDM, treatment plan, and plan for follow-up with patient  patient agrees with plan.   Meds ordered this encounter  Medications  . albuterol (VENTOLIN HFA) 108 (90 Base) MCG/ACT inhaler    Sig: Inhale 1-2 puffs into the lungs every 4 (four) hours as needed for wheezing or shortness of breath.    Dispense:  18 g    Refill:  0  . Spacer/Aero-Holding Chambers (AEROCHAMBER PLUS) inhaler    Sig: Use as instructed    Dispense:  1 each    Refill:  2  . ibuprofen (ADVIL) 600 MG tablet    Sig: Take 1 tablet (600 mg total) by mouth every 6 (six) hours as needed.    Dispense:  30 tablet    Refill:  0  . ondansetron (ZOFRAN ODT) 8 MG disintegrating tablet    Sig: 1/2- 1 tablet q 8 hr prn nausea, vomiting    Dispense:  20 tablet    Refill:  0    *This clinic note was created using Scientist, clinical (histocompatibility and immunogenetics). Therefore, there may be occasional mistakes despite careful proofreading.  ?    Domenick Gong, MD 06/15/19 [phone]

## 2019-06-15 LAB — NOVEL CORONAVIRUS, NAA: SARS-CoV-2, NAA: NOT DETECTED

## 2019-06-21 DIAGNOSIS — R05 Cough: Secondary | ICD-10-CM | POA: Diagnosis not present

## 2019-06-21 DIAGNOSIS — R0600 Dyspnea, unspecified: Secondary | ICD-10-CM | POA: Diagnosis not present

## 2019-06-21 DIAGNOSIS — R0602 Shortness of breath: Secondary | ICD-10-CM | POA: Diagnosis not present

## 2019-06-21 DIAGNOSIS — R634 Abnormal weight loss: Secondary | ICD-10-CM | POA: Diagnosis not present

## 2019-06-21 DIAGNOSIS — R5383 Other fatigue: Secondary | ICD-10-CM | POA: Diagnosis not present

## 2019-06-21 DIAGNOSIS — R52 Pain, unspecified: Secondary | ICD-10-CM | POA: Diagnosis not present

## 2019-08-06 ENCOUNTER — Other Ambulatory Visit: Payer: Self-pay

## 2019-08-06 ENCOUNTER — Ambulatory Visit (INDEPENDENT_AMBULATORY_CARE_PROVIDER_SITE_OTHER): Payer: BC Managed Care – PPO | Admitting: Sports Medicine

## 2019-08-06 DIAGNOSIS — M75102 Unspecified rotator cuff tear or rupture of left shoulder, not specified as traumatic: Secondary | ICD-10-CM

## 2019-08-06 NOTE — Progress Notes (Signed)
    Procedures performed today:    Procedure: Real-time Ultrasound Guided injection of the left glenohumeral joint Device: Samsung HS60  Verbal informed consent obtained.  Time-out conducted.  Noted no overlying erythema, induration, or other signs of local infection.  Skin prepped in a sterile fashion.  Local anesthesia: Topical Ethyl chloride.  With sterile technique and under real time ultrasound guidance: 1 cc Kenalog 40, 2 cc lidocaine, 2 cc bupivacaine injected easily Completed without difficulty  Pain immediately resolved suggesting accurate placement of the medication.  Advised to call if fevers/chills, erythema, induration, drainage, or persistent bleeding.  Images permanently stored and available for review in the ultrasound unit.  Impression: Technically successful ultrasound guided injection.  Procedure: Real-time Ultrasound Guided injection of the left subacromial bursa Device: Samsung HS60  Verbal informed consent obtained.  Time-out conducted.  Noted no overlying erythema, induration, or other signs of local infection.  Skin prepped in a sterile fashion.  Local anesthesia: Topical Ethyl chloride.  With sterile technique and under real time ultrasound guidance: 1 cc Kenalog 40, 1 cc lidocaine, 1 cc bupivacaine injected easily Completed without difficulty  Pain immediately resolved suggesting accurate placement of the medication.  Advised to call if fevers/chills, erythema, induration, drainage, or persistent bleeding.  Images permanently stored and available for review in the ultrasound unit.  Impression: Technically successful ultrasound guided injection.  Independent interpretation of notes and tests performed by another provider:   None.  Impression and Recommendations:    Multifactorial left shoulder pain Archie Patten returns, she is a pleasant 54 year old female, she has multifactorial left shoulder pain, rotator cuff tendinopathy/subacromial bursitis as well as  adhesive capsulitis. In September of last year we performed subacromial and glenohumeral injections, and she did extremely well. She returns today with recurrence of symptoms, I performed a left subacromial and left glenohumeral injection today. She will do her home rehab and return to see me in a month, formal physical therapy if no better.    ___________________________________________ Ihor Austin. Benjamin Stain, M.D., ABFM., CAQSM. Primary Care and Sports Medicine [facility]  Adjunct Instructor of Family Medicine  [employer]

## 2019-08-06 NOTE — Assessment & Plan Note (Signed)
Jillian Meza returns, she is a pleasant 54 year old female, she has multifactorial left shoulder pain, rotator cuff tendinopathy/subacromial bursitis as well as adhesive capsulitis. In September of last year we performed subacromial and glenohumeral injections, and she did extremely well. She returns today with recurrence of symptoms, I performed a left subacromial and left glenohumeral injection today. She will do her home rehab and return to see me in a month, formal physical therapy if no better.

## 2019-09-06 ENCOUNTER — Ambulatory Visit: Payer: BC Managed Care – PPO | Admitting: Sports Medicine

## 2021-01-08 IMAGING — DX LEFT SHOULDER - 2+ VIEW
3 series · 3 of 3 positions shown · non-contrast
Comparison: None.

CLINICAL DATA: Fall, left shoulder pain

EXAM:
LEFT SHOULDER - 2+ VIEW

[shoulder grashey]
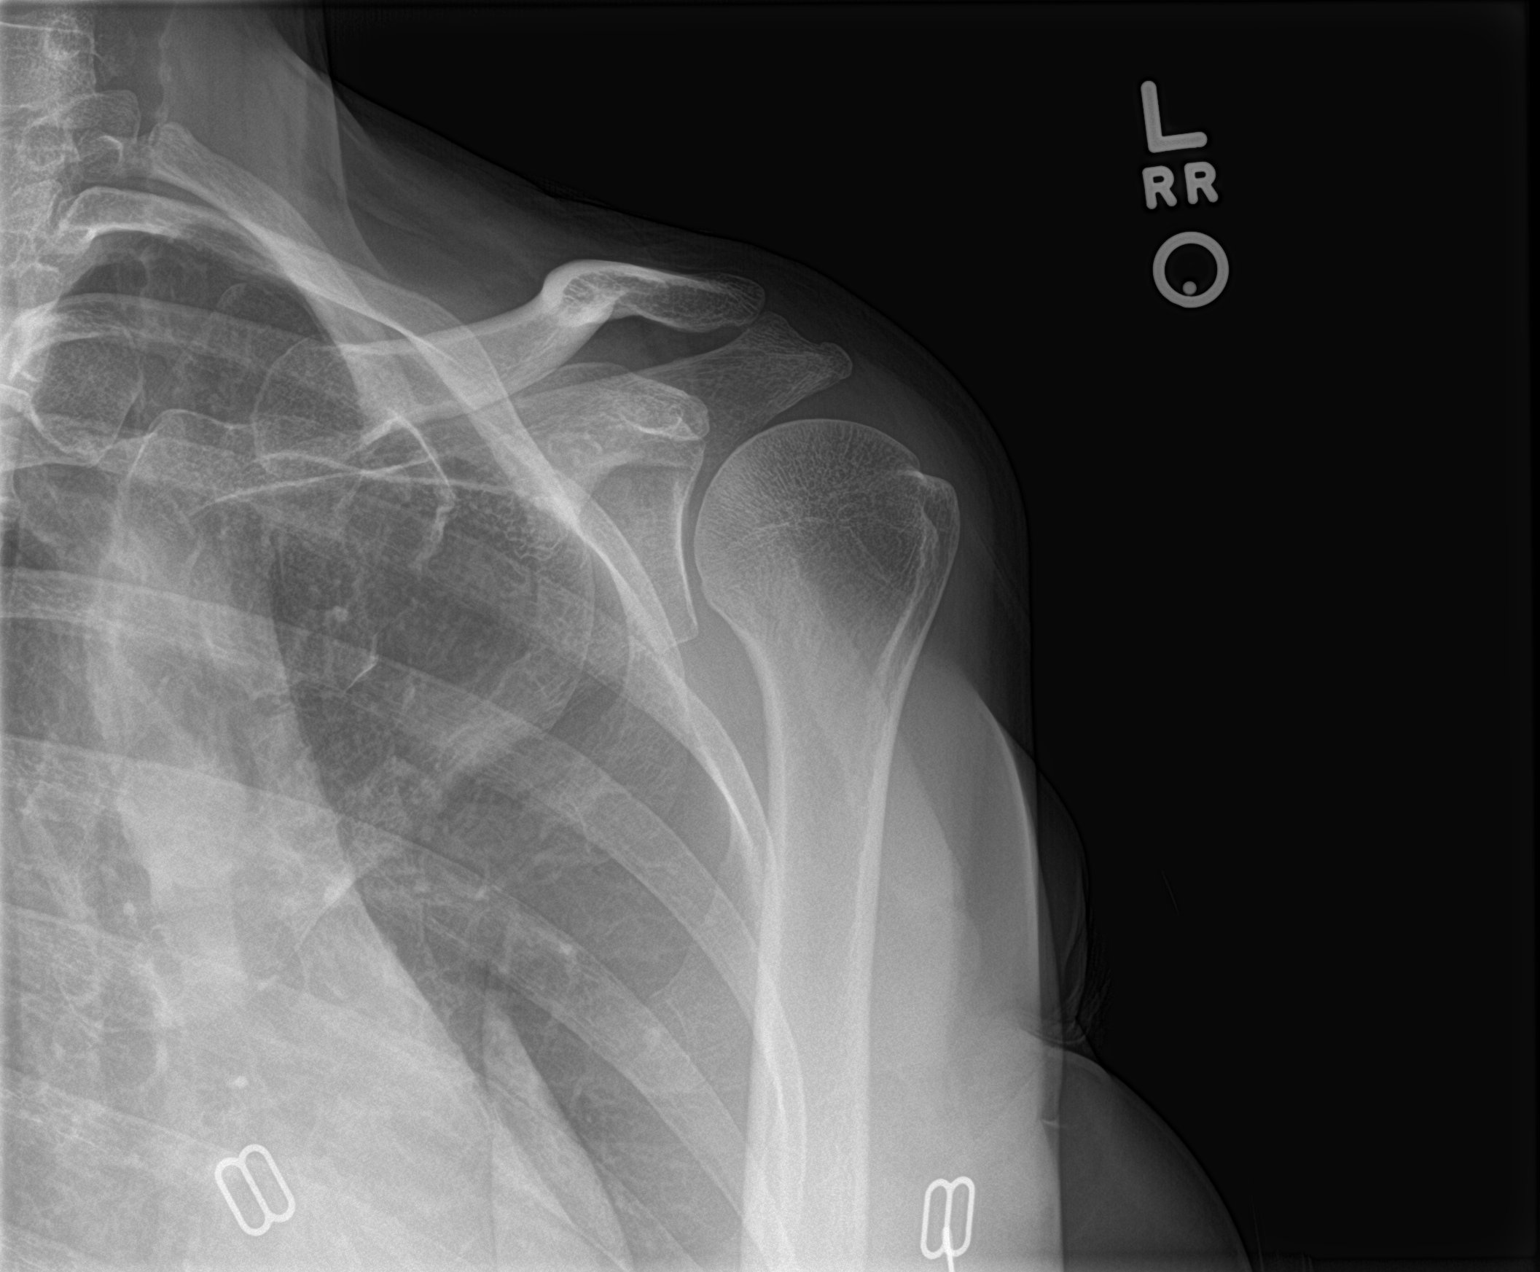

[shoulder y view]
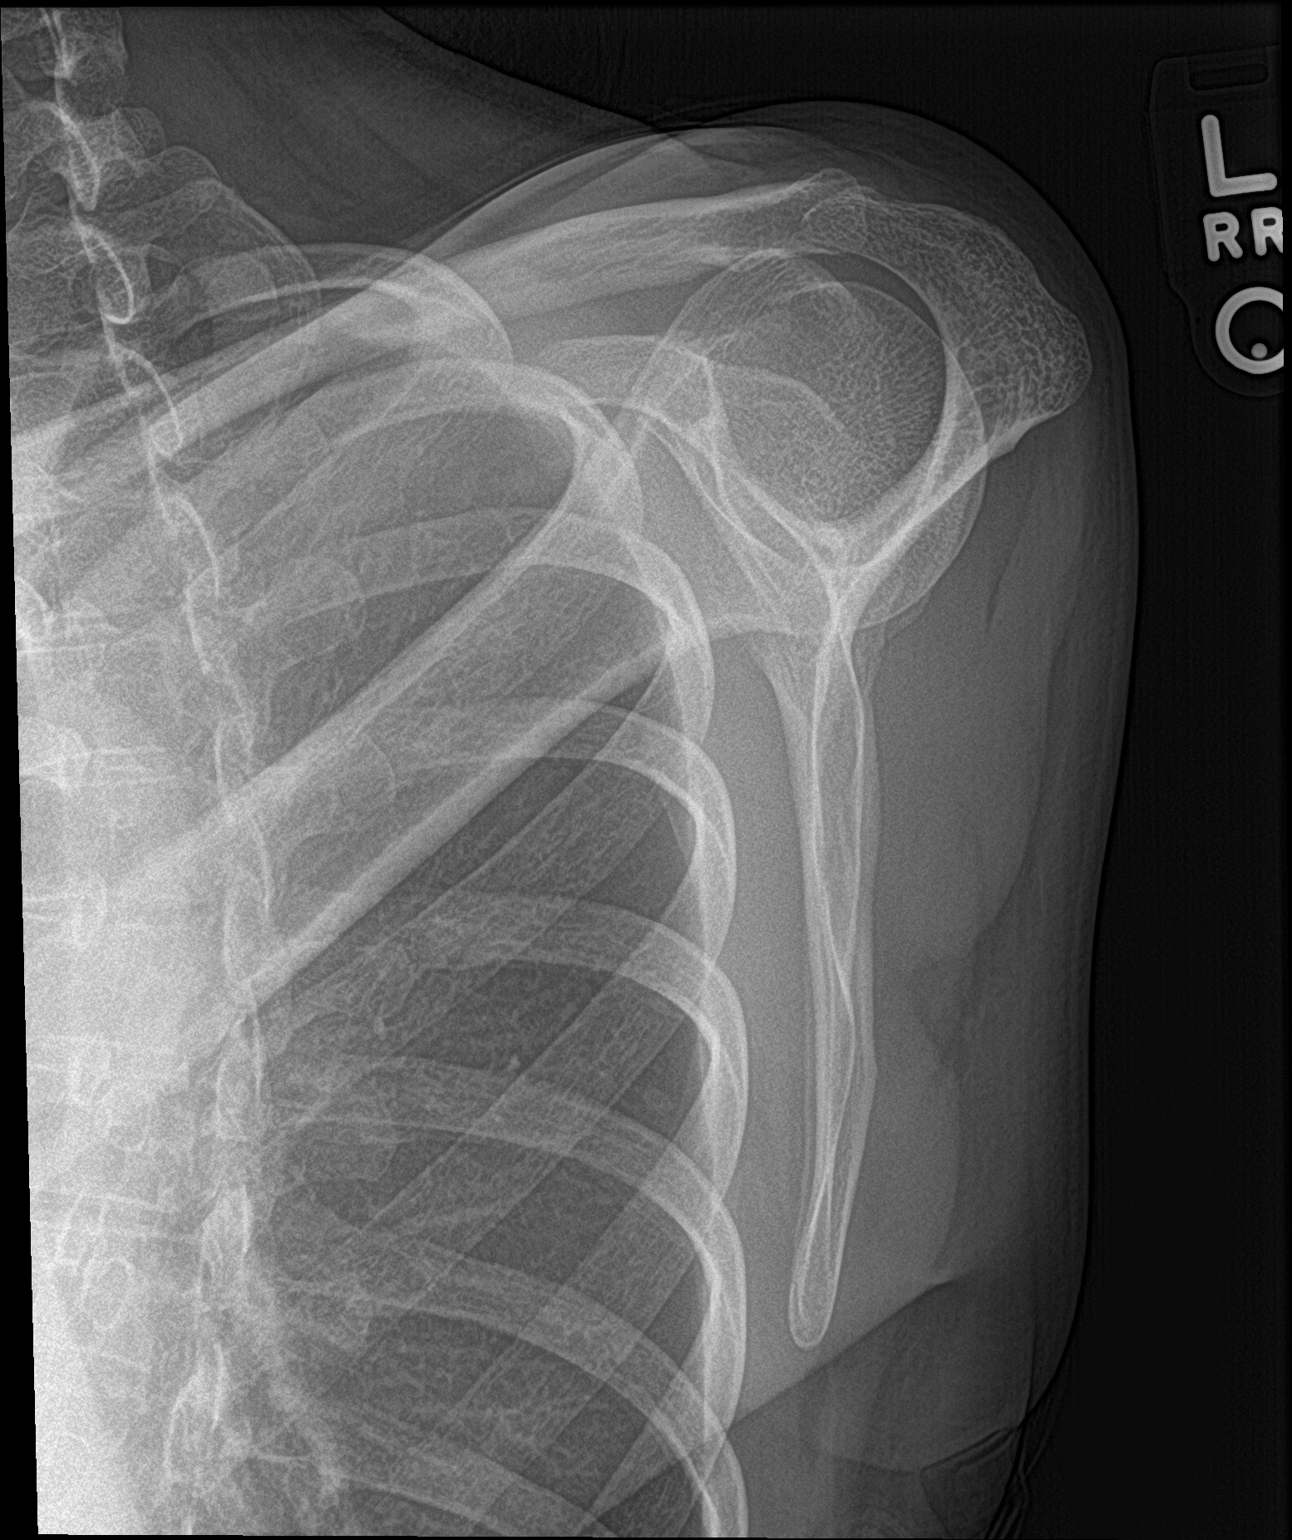

[shoulder axillary]
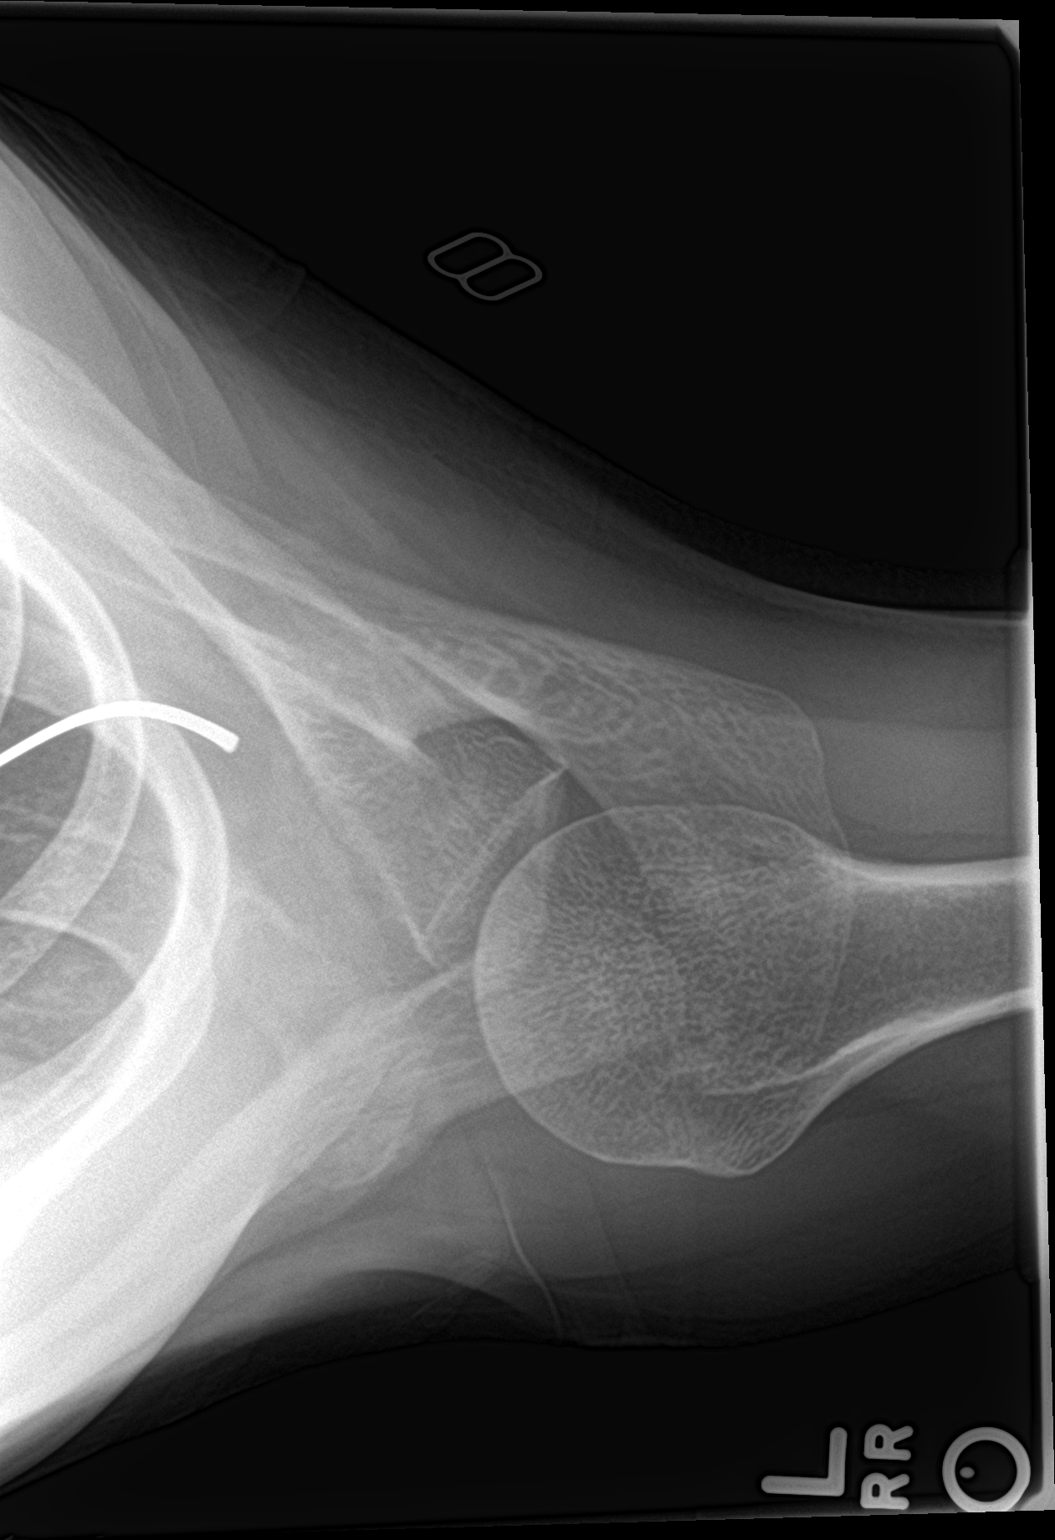

[3 of 3 positions shown; findings below may reference images not displayed]

FINDINGS: There is no evidence of fracture or dislocation. There is no
evidence of arthropathy or other focal bone abnormality. Soft
tissues are unremarkable.
IMPRESSION: Negative.

## 2021-04-25 ENCOUNTER — Other Ambulatory Visit: Payer: Self-pay

## 2021-04-25 ENCOUNTER — Emergency Department (INDEPENDENT_AMBULATORY_CARE_PROVIDER_SITE_OTHER)
Admission: EM | Admit: 2021-04-25 | Discharge: 2021-04-25 | Disposition: A | Discharge status: Home / Self Care | Payer: BC Managed Care – PPO | Source: Home / Self Care | Attending: Family Medicine | Admitting: Family Medicine

## 2021-04-25 DIAGNOSIS — J069 Acute upper respiratory infection, unspecified: Secondary | ICD-10-CM | POA: Diagnosis not present

## 2021-04-25 MED ORDER — AZITHROMYCIN 250 MG PO TABS: 250.0000 mg | ORAL_TABLET | Freq: Every day | ORAL | 0 refills | Status: DC

## 2021-04-25 MED ORDER — PREDNISONE 20 MG PO TABS: 20.0000 mg | ORAL_TABLET | Freq: Two times a day (BID) | ORAL | 0 refills | Status: DC

## 2021-04-25 NOTE — Discharge Instructions (Signed)
Take the azithromycin as directed.  2 pills today then 1 a day until gone Take the prednisone as directed.  2 pills a day for 5 days.  Take 2 pills today Drink lots of fluids May continue with over-the-counter cough and cold medicines Follow-up with your PCP

## 2021-04-25 NOTE — ED Triage Notes (Signed)
Pt states that she has a cough, chest congestion, sneezing, nasal congestion, sore throat. X3-4 days  Pt states that she is vaccinated for covid.  Pt states that she hasn't had flu vaccine.

## 2021-04-25 NOTE — ED Provider Notes (Signed)
[facility]    CSN: [id number] Arrival date & time: 04/25/21  1224      History   Chief Complaint Chief Complaint  Patient presents with   Cough    Cough, chest congestion, sore throat, nasal congestion, sneezing. X3-4 days    HPI Jillian Meza is a 55 y.o. female.   HPI Has been sick for a week with runny nose, postnasal drip, cough, headache, weakness, chest congestion.  Exposed to strep.  Does not have a lot of sore throat, just some scratchy throat.  Unknown exposure to flu. Past Medical History:  Diagnosis Date   High cholesterol     Patient Active Problem List   Diagnosis Date Noted   Multifactorial left shoulder pain 01/29/2019    Past Surgical History:  Procedure Laterality Date   CESAREAN SECTION     CHOLECYSTECTOMY      OB History   No obstetric history on file.      Home Medications    Prior to Admission medications   Medication Sig Start Date End Date Taking? Authorizing Provider  Acetaminophen (TYLENOL 8 HOUR PO) Take by mouth.   Yes [provider]  azithromycin (ZITHROMAX) 250 MG tablet Take 1 tablet (250 mg total) by mouth daily. Take first 2 tablets together, then 1 every day until finished. 04/25/21  Yes Eustace Moore, MD  Pediatric Multivitamins-[facility] COMPLETE) 18 MG CHEW Chew by mouth.   Yes [provider]  predniSONE (DELTASONE) 20 MG tablet Take 1 tablet (20 mg total) by mouth 2 (two) times daily with a meal. 04/25/21  Yes Eustace Moore, MD    Family History Family History  Problem Relation Age of Onset   Cancer Other        BREAST    Social History Social History   Tobacco Use   Smoking status: Never   Smokeless tobacco: Never  Vaping Use   Vaping Use: Never used  Substance Use Topics   Alcohol use: No   Drug use: No     Allergies   Codeine, Erythromycin, and Percocet [oxycodone-acetaminophen]   Review of Systems Review of Systems See HPI  Physical Exam Triage  Vital Signs ED Triage Vitals  Enc Vitals Group     BP 04/25/21 1347 119/77     Pulse Rate 04/25/21 1347 67     Resp 04/25/21 1347 18     Temp 04/25/21 1347 98.2 F (36.8 C)     Temp Source 04/25/21 1347 Oral     SpO2 04/25/21 1347 100 %     Weight 04/25/21 1343 140 lb (63.5 kg)     Height 04/25/21 1343 5\' 6"  (1.676 m)     Head Circumference --      Peak Flow --      Pain Score 04/25/21 1343 0     Pain Loc --      Pain Edu? --      Excl. in [place]? --    No data found.  Updated Vital Signs BP 119/77 (BP Location: Left Arm)   Pulse 67   Temp 98.2 F (36.8 C) (Oral)   Resp 18   Ht 5\' 6"  (1.676 m)   Wt 63.5 kg   SpO2 100%   BMI 22.60 kg/m      Physical Exam Constitutional:      General: She is not in acute distress.    Appearance: She is well-developed. She is ill-appearing.  HENT:     Head:  Normocephalic and atraumatic.     Right Ear: Tympanic membrane and ear canal normal.     Left Ear: Tympanic membrane and ear canal normal.     Nose: Congestion and rhinorrhea present.     Mouth/Throat:     Pharynx: Posterior oropharyngeal erythema present.  Eyes:     Conjunctiva/sclera: Conjunctivae normal.     Pupils: Pupils are equal, round, and reactive to light.  Cardiovascular:     Rate and Rhythm: Normal rate and regular rhythm.     Heart sounds: Normal heart sounds.  Pulmonary:     Effort: Pulmonary effort is normal. No respiratory distress.     Breath sounds: Rhonchi present.  Abdominal:     General: There is no distension.     Palpations: Abdomen is soft.  Musculoskeletal:        General: Normal range of motion.     Cervical back: Normal range of motion.  Lymphadenopathy:     Cervical: Cervical adenopathy present.  Skin:    General: Skin is warm and dry.  Neurological:     Mental Status: She is alert.  Psychiatric:        Mood and Affect: Mood normal.        Behavior: Behavior normal.     UC Treatments / Results  Labs (all labs ordered are listed, but  only abnormal results are displayed) Labs Reviewed - No data to display  EKG   Radiology No results found.  Procedures Procedures (including critical care time)  Medications Ordered in UC Medications - No data to display  Initial Impression / Assessment and Plan / UC Course  I have reviewed the triage vital signs and the nursing notes.  Pertinent labs & imaging results that were available during my care of the patient were reviewed by me and considered in my medical decision making (see chart for details).     Patient has an upper respiratory infection.  Likely started as well.  Feels like she is getting worse after a week.  Cover with an antibiotic as well as prednisone. Final Clinical Impressions(s) / UC Diagnoses   Final diagnoses:  Acute upper respiratory infection     Discharge Instructions      Take the azithromycin as directed.  2 pills today then 1 a day until gone Take the prednisone as directed.  2 pills a day for 5 days.  Take 2 pills today Drink lots of fluids May continue with over-the-counter cough and cold medicines Follow-up with your PCP   ED Prescriptions     Medication Sig Dispense Auth. Provider   azithromycin (ZITHROMAX) 250 MG tablet Take 1 tablet (250 mg total) by mouth daily. Take first 2 tablets together, then 1 every day until finished. 6 tablet Eustace Moore, MD   predniSONE (DELTASONE) 20 MG tablet Take 1 tablet (20 mg total) by mouth 2 (two) times daily with a meal. 10 tablet Eustace Moore, MD      PDMP not reviewed this encounter.   Eustace Moore, MD 04/25/21 [phone]

## 2021-12-12 DIAGNOSIS — Z124 Encounter for screening for malignant neoplasm of cervix: Secondary | ICD-10-CM | POA: Diagnosis not present

## 2021-12-12 DIAGNOSIS — R109 Unspecified abdominal pain: Secondary | ICD-10-CM | POA: Diagnosis not present

## 2021-12-12 DIAGNOSIS — R140 Abdominal distension (gaseous): Secondary | ICD-10-CM | POA: Diagnosis not present

## 2021-12-12 DIAGNOSIS — Z8742 Personal history of other diseases of the female genital tract: Secondary | ICD-10-CM | POA: Diagnosis not present

## 2021-12-24 DIAGNOSIS — R109 Unspecified abdominal pain: Secondary | ICD-10-CM | POA: Diagnosis not present

## 2021-12-24 DIAGNOSIS — Z8742 Personal history of other diseases of the female genital tract: Secondary | ICD-10-CM | POA: Diagnosis not present

## 2021-12-24 DIAGNOSIS — Z9049 Acquired absence of other specified parts of digestive tract: Secondary | ICD-10-CM | POA: Diagnosis not present

## 2021-12-24 DIAGNOSIS — E785 Hyperlipidemia, unspecified: Secondary | ICD-10-CM | POA: Diagnosis not present

## 2021-12-24 DIAGNOSIS — R140 Abdominal distension (gaseous): Secondary | ICD-10-CM | POA: Diagnosis not present

## 2021-12-24 DIAGNOSIS — R1030 Lower abdominal pain, unspecified: Secondary | ICD-10-CM | POA: Diagnosis not present

## 2021-12-24 DIAGNOSIS — R9389 Abnormal findings on diagnostic imaging of other specified body structures: Secondary | ICD-10-CM | POA: Diagnosis not present

## 2021-12-24 DIAGNOSIS — N840 Polyp of corpus uteri: Secondary | ICD-10-CM | POA: Diagnosis not present

## 2021-12-31 DIAGNOSIS — N838 Other noninflammatory disorders of ovary, fallopian tube and broad ligament: Secondary | ICD-10-CM | POA: Diagnosis not present

## 2022-02-11 DIAGNOSIS — R9389 Abnormal findings on diagnostic imaging of other specified body structures: Secondary | ICD-10-CM | POA: Diagnosis not present

## 2022-02-11 DIAGNOSIS — R140 Abdominal distension (gaseous): Secondary | ICD-10-CM | POA: Diagnosis not present

## 2022-02-11 DIAGNOSIS — N951 Menopausal and female climacteric states: Secondary | ICD-10-CM | POA: Diagnosis not present

## 2022-02-11 DIAGNOSIS — R198 Other specified symptoms and signs involving the digestive system and abdomen: Secondary | ICD-10-CM | POA: Diagnosis not present

## 2022-02-11 DIAGNOSIS — Q505 Embryonic cyst of broad ligament: Secondary | ICD-10-CM | POA: Diagnosis not present

## 2022-02-11 DIAGNOSIS — R635 Abnormal weight gain: Secondary | ICD-10-CM | POA: Diagnosis not present

## 2022-02-11 DIAGNOSIS — N838 Other noninflammatory disorders of ovary, fallopian tube and broad ligament: Secondary | ICD-10-CM | POA: Diagnosis not present

## 2022-02-11 DIAGNOSIS — N840 Polyp of corpus uteri: Secondary | ICD-10-CM | POA: Diagnosis not present

## 2022-04-08 DIAGNOSIS — R198 Other specified symptoms and signs involving the digestive system and abdomen: Secondary | ICD-10-CM | POA: Diagnosis not present

## 2022-04-08 DIAGNOSIS — N840 Polyp of corpus uteri: Secondary | ICD-10-CM | POA: Diagnosis not present

## 2022-04-08 DIAGNOSIS — N951 Menopausal and female climacteric states: Secondary | ICD-10-CM | POA: Diagnosis not present

## 2022-04-08 DIAGNOSIS — Z0000 Encounter for general adult medical examination without abnormal findings: Secondary | ICD-10-CM | POA: Diagnosis not present

## 2022-04-08 DIAGNOSIS — R140 Abdominal distension (gaseous): Secondary | ICD-10-CM | POA: Diagnosis not present

## 2022-04-08 DIAGNOSIS — E785 Hyperlipidemia, unspecified: Secondary | ICD-10-CM | POA: Diagnosis not present

## 2022-04-08 DIAGNOSIS — Z1231 Encounter for screening mammogram for malignant neoplasm of breast: Secondary | ICD-10-CM | POA: Diagnosis not present

## 2022-05-06 DIAGNOSIS — R635 Abnormal weight gain: Secondary | ICD-10-CM | POA: Diagnosis not present

## 2022-05-06 DIAGNOSIS — R140 Abdominal distension (gaseous): Secondary | ICD-10-CM | POA: Diagnosis not present

## 2022-05-06 DIAGNOSIS — K5904 Chronic idiopathic constipation: Secondary | ICD-10-CM | POA: Diagnosis not present

## 2022-07-16 ENCOUNTER — Telehealth: Payer: Self-pay | Admitting: Urgent Care

## 2022-07-16 ENCOUNTER — Ambulatory Visit
Admission: EM | Admit: 2022-07-16 | Discharge: 2022-07-16 | Disposition: A | Discharge status: Home / Self Care | Payer: BC Managed Care – PPO | Attending: Urgent Care | Admitting: Urgent Care

## 2022-07-16 DIAGNOSIS — Z20828 Contact with and (suspected) exposure to other viral communicable diseases: Secondary | ICD-10-CM | POA: Diagnosis not present

## 2022-07-16 DIAGNOSIS — U071 COVID-19: Secondary | ICD-10-CM | POA: Diagnosis not present

## 2022-07-16 DIAGNOSIS — Z20822 Contact with and (suspected) exposure to covid-19: Secondary | ICD-10-CM | POA: Diagnosis not present

## 2022-07-16 DIAGNOSIS — R6889 Other general symptoms and signs: Secondary | ICD-10-CM | POA: Diagnosis not present

## 2022-07-16 DIAGNOSIS — R6883 Chills (without fever): Secondary | ICD-10-CM | POA: Diagnosis present

## 2022-07-16 DIAGNOSIS — R059 Cough, unspecified: Secondary | ICD-10-CM | POA: Diagnosis present

## 2022-07-16 LAB — POCT INFLUENZA A/B
Influenza A, POC: NEGATIVE
Influenza B, POC: NEGATIVE

## 2022-07-16 LAB — POC SARS CORONAVIRUS 2 AG -  ED: SARS Coronavirus 2 Ag: NEGATIVE

## 2022-07-16 MED ORDER — OSELTAMIVIR PHOSPHATE 75 MG PO CAPS: 75.0000 mg | ORAL_CAPSULE | Freq: Two times a day (BID) | ORAL | 0 refills | Status: AC

## 2022-07-16 MED ORDER — XOFLUZA (40 MG DOSE) 1 X 40 MG PO TBPK: 1.0000 | ORAL_TABLET | Freq: Once | ORAL | 0 refills | Status: AC

## 2022-07-16 NOTE — ED Provider Notes (Signed)
[facility]    CSN: [id number] Arrival date & time: 07/16/22  1006      History   Chief Complaint Chief Complaint  Patient presents with   Cough   Chills   Generalized Body Aches    HPI Jillian Meza is a 57 y.o. female.   57 year old female presents today due to concern of possible COVID or flu.  Patient states she was with her husband in our urgent care this past weekend on Saturday, and he was diagnosed with both COVID and influenza.  Patient states she started getting some mild congestion and a dry cough on Sunday, but yesterday evening developed severe body aches, chills, and a worsening cough.  She has not been taking any over-the-counter medications as she has an ovarian cyst excision scheduled for this Friday and was instructed not to take any OTC meds.    Cough Associated symptoms: chills, fever and myalgias   Associated symptoms: no ear pain, no rhinorrhea and no sore throat     Past Medical History:  Diagnosis Date   High cholesterol     Patient Active Problem List   Diagnosis Date Noted   Multifactorial left shoulder pain 01/29/2019    Past Surgical History:  Procedure Laterality Date   CESAREAN SECTION     CHOLECYSTECTOMY      OB History   No obstetric history on file.      Home Medications    Prior to Admission medications   Medication Sig Start Date End Date Taking? Authorizing Provider  Baloxavir Marboxil,40 MG Dose, (XOFLUZA, 40 MG DOSE,) 1 x 40 MG TBPK Take 1 tablet by mouth once for 1 dose. 07/16/22 07/16/22  Chaney Malling, PA    Family History Family History  Problem Relation Age of Onset   Cancer Other        BREAST    Social History Social History   Tobacco Use   Smoking status: Never   Smokeless tobacco: Never  Vaping Use   Vaping Use: Never used  Substance Use Topics   Alcohol use: No   Drug use: No     Allergies   Codeine, Erythromycin, and Percocet [oxycodone-acetaminophen]   Review of  Systems Review of Systems  Constitutional:  Positive for chills and fever.  HENT:  Negative for congestion, dental problem, drooling, ear discharge, ear pain, facial swelling, hearing loss, mouth sores, nosebleeds, postnasal drip, rhinorrhea, sinus pressure, sinus pain, sneezing and sore throat.   Eyes: Negative.   Respiratory:  Positive for cough.   Cardiovascular: Negative.   Gastrointestinal:  Positive for nausea.  Endocrine: Negative.   Genitourinary: Negative.   Musculoskeletal:  Positive for myalgias. Negative for arthralgias and back pain.  Skin: Negative.   Neurological: Negative.   Hematological: Negative.   Psychiatric/Behavioral: Negative.    All other systems reviewed and are negative.    Physical Exam Triage Vital Signs ED Triage Vitals  Enc Vitals Group     BP 07/16/22 1017 119/84     Pulse Rate 07/16/22 1017 (!) 102     Resp 07/16/22 1017 14     Temp 07/16/22 1017 99.2 F (37.3 C)     Temp Source 07/16/22 1017 Oral     SpO2 07/16/22 1017 99 %     Weight --      Height --      Head Circumference --      Peak Flow --      Pain Score 07/16/22 1015 4  Pain Loc --      Pain Edu? --      Excl. in [place]? --    No data found.  Updated Vital Signs BP 119/84 (BP Location: Right Arm)   Pulse (!) 102   Temp 99.2 F (37.3 C) (Oral)   Resp 14   SpO2 99%   Visual Acuity Right Eye Distance:   Left Eye Distance:   Bilateral Distance:    Right Eye Near:   Left Eye Near:    Bilateral Near:     Physical Exam Vitals and nursing note reviewed.  Constitutional:      General: She is not in acute distress.    Appearance: She is well-developed and normal weight. She is ill-appearing. She is not toxic-appearing or diaphoretic.  HENT:     Head: Normocephalic and atraumatic.     Right Ear: Tympanic membrane, ear canal and external ear normal. There is no impacted cerumen.     Left Ear: Tympanic membrane, ear canal and external ear normal. There is no impacted  cerumen.     Nose: Nose normal. No congestion or rhinorrhea.     Mouth/Throat:     Mouth: Mucous membranes are moist.     Pharynx: No oropharyngeal exudate or posterior oropharyngeal erythema.  Eyes:     General: No scleral icterus.       Right eye: No discharge.        Left eye: No discharge.     Extraocular Movements: Extraocular movements intact.     Conjunctiva/sclera: Conjunctivae normal.     Pupils: Pupils are equal, round, and reactive to light.  Cardiovascular:     Rate and Rhythm: Regular rhythm. Tachycardia present.     Pulses: Normal pulses.     Heart sounds: Normal heart sounds. No murmur heard.    No gallop.  Pulmonary:     Effort: Pulmonary effort is normal. No respiratory distress.     Breath sounds: Normal breath sounds. No stridor. No wheezing, rhonchi or rales.  Chest:     Chest wall: No tenderness.  Abdominal:     Palpations: Abdomen is soft.     Tenderness: There is no abdominal tenderness.  Musculoskeletal:        General: No swelling or tenderness. Normal range of motion.     Cervical back: Normal range of motion and neck supple. No tenderness.     Right lower leg: No edema.     Left lower leg: No edema.  Lymphadenopathy:     Cervical: No cervical adenopathy.  Skin:    General: Skin is warm and dry.     Capillary Refill: Capillary refill takes less than 2 seconds.  Neurological:     Mental Status: She is alert.  Psychiatric:        Mood and Affect: Mood normal.      UC Treatments / Results  Labs (all labs ordered are listed, but only abnormal results are displayed) Labs Reviewed  SARS CORONAVIRUS 2 (TAT 6-24 HRS)  POC SARS CORONAVIRUS 2 AG -  ED  POCT INFLUENZA A/B    EKG   Radiology No results found.  Procedures Procedures (including critical care time)  Medications Ordered in UC Medications - No data to display  Initial Impression / Assessment and Plan / UC Course  I have reviewed the triage vital signs and the nursing  notes.  Pertinent labs & imaging results that were available during my care of the patient were reviewed by me  and considered in my medical decision making (see chart for details).     Flu-like sx -patient with known exposure to influenza.  She has had symptoms for roughly 12 hours, therefore I suspect a false negative rapid flu test in our office.  We will start her on single tablet Xofluza to take today.  Additional over-the-counter supportive measures discussed with patient. Exposure to the flu -as above Exposure to COVID-19 -our in office test is negative, patient only on day 2 of symptoms.  It is possible given her known exposure this is a false negative.  Given her pending surgery in 3 days, we will send out a PCR test as well.  If positive, patient will need to reschedule surgery.   Final Clinical Impressions(s) / UC Diagnoses   Final diagnoses:  Flu-like symptoms  Exposure to the flu  Exposure to COVID-19 virus     Discharge Instructions      Your in office covid test is negative.  We sent out a PCR test for confirmation. Because of your current symptoms and known exposure to influenza, I would recommend treatment.  Please take xofluza, one tablet in a single dose. You can also purchase OTC oscillococcinum to help with fatigue and body aches.  REST and drink plenty of water.  Alternate ibuprofen and tylenol as needed for fever or body aches.     ED Prescriptions     Medication Sig Dispense Auth. Provider   Baloxavir Marboxil,40 MG Dose, (XOFLUZA, 40 MG DOSE,) 1 x 40 MG TBPK Take 1 tablet by mouth once for 1 dose. 1 each Chaney Malling, PA      PDMP not reviewed this encounter.   Chaney Malling, [place] 07/16/22 1132

## 2022-07-16 NOTE — Telephone Encounter (Signed)
Pt called stating her pharmacy does not have xofluza in stock. Pt given options of taking tamiflu instead, or having xofluza called in to a different pharmacy. She is requiesting tamiflu. Pt aware of twice daily dosing x 5 days and possible side effects of HA, N,V,D.

## 2022-07-16 NOTE — Discharge Instructions (Addendum)
Your in office covid test is negative.  We sent out a PCR test for confirmation. Because of your current symptoms and known exposure to influenza, I would recommend treatment.  Please take xofluza, one tablet in a single dose. You can also purchase OTC oscillococcinum to help with fatigue and body aches.  REST and drink plenty of water.  Alternate ibuprofen and tylenol as needed for fever or body aches.

## 2022-07-16 NOTE — ED Triage Notes (Signed)
Pt presents with URI sx, pt exposed to covid and flu

## 2022-07-17 LAB — SARS CORONAVIRUS 2 (TAT 6-24 HRS): SARS Coronavirus 2: POSITIVE — AB

## 2024-04-07 ENCOUNTER — Ambulatory Visit
Admission: EM | Admit: 2024-04-07 | Discharge: 2024-04-07 | Disposition: A | Discharge status: Home / Self Care | Attending: Family Medicine | Admitting: Family Medicine

## 2024-04-07 ENCOUNTER — Encounter: Payer: Self-pay | Admitting: Emergency Medicine

## 2024-04-07 DIAGNOSIS — J111 Influenza due to unidentified influenza virus with other respiratory manifestations: Secondary | ICD-10-CM

## 2024-04-07 MED ORDER — AZITHROMYCIN 250 MG PO TABS: ORAL_TABLET | ORAL | 0 refills | Status: DC

## 2024-04-07 MED ORDER — PREDNISONE 20 MG PO TABS: 40.0000 mg | ORAL_TABLET | Freq: Every day | ORAL | 0 refills | Status: DC

## 2024-04-07 NOTE — ED Triage Notes (Signed)
 Pt states for the last couple weeks she has been having congestion and fatigue, states yesterday she developed body aches and chest congestion. Has not taken anything for this.

## 2024-04-07 NOTE — Discharge Instructions (Signed)
 Try to get enough rest Drink lots of water Take the antibiotic as directed.  2 pills today then 1 a day until gone Take prednisone  daily for 5 days.  This helps with allergies and congestion See your doctor if not improving by next week

## 2024-04-07 NOTE — ED Provider Notes (Signed)
 Jillian Meza    CSN: [id number] Arrival date & time: 04/07/24  1415      History   Chief Complaint Chief Complaint  Patient presents with   Generalized Body Aches    HPI Jillian Meza is a 58 y.o. female.   Patient states that she has felt achy and tired for couple of weeks.  She felt like it might be allergies.  She felt like she might be coming down with something.  States has been going on for 2 weeks she is concerned that it is not going away.  Recently she has developed more of a cough.  No real sinus drainage or sore throat.  No fever or chills.  No nausea or vomiting although her appetite is diminished.  No known exposure to illness.  Generally enjoys good health.  No underlying lung disease although was told many years ago that she had asthma related to an infection.    Past Medical History:  Diagnosis Date   High cholesterol     Patient Active Problem List   Diagnosis Date Noted   Multifactorial left shoulder pain 01/29/2019    Past Surgical History:  Procedure Laterality Date   CESAREAN SECTION     CHOLECYSTECTOMY      OB History   No obstetric history on file.      Home Medications    Prior to Admission medications   Medication Sig Start Date End Date Taking? Authorizing Provider  azithromycin  (ZITHROMAX  Z-PAK) 250 MG tablet Take two pills today followed by one a day until gone 04/07/24  Yes Maranda Jamee Jacob, MD  predniSONE  (DELTASONE ) 20 MG tablet Take 2 tablets (40 mg total) by mouth daily with breakfast. 04/07/24  Yes Maranda Jamee Jacob, MD  simvastatin (ZOCOR) 10 MG tablet Take 10 mg by mouth every evening. 03/29/24  Yes [provider]    Family History Family History  Problem Relation Age of Onset   Cancer Other        BREAST    Social History Social History   Tobacco Use   Smoking status: Never   Smokeless tobacco: Never  Vaping Use   Vaping status: Never Used  Substance Use Topics   Alcohol use: No   Drug  use: No     Allergies   Codeine, Erythromycin, and Percocet [oxycodone-acetaminophen]   Review of Systems Review of Systems See HPI  Physical Exam Triage Vital Signs ED Triage Vitals  Encounter Vitals Group     BP 04/07/24 1438 135/82     Girls Systolic BP Percentile --      Girls Diastolic BP Percentile --      Boys Systolic BP Percentile --      Boys Diastolic BP Percentile --      Pulse Rate 04/07/24 1438 72     Resp --      Temp 04/07/24 1438 97.9 F (36.6 C)     Temp Source 04/07/24 1438 Oral     SpO2 04/07/24 1438 97 %     Weight --      Height --      Head Circumference --      Peak Flow --      Pain Score 04/07/24 1440 5     Pain Loc --      Pain Education --      Exclude from Growth Chart --    No data found.  Updated Vital Signs BP 135/82   Pulse 72  Temp 97.9 F (36.6 C) (Oral)   SpO2 97%      Physical Exam Constitutional:      General: She is not in acute distress.    Appearance: She is well-developed.  HENT:     Head: Normocephalic and atraumatic.     Right Ear: Tympanic membrane normal.     Left Ear: Tympanic membrane normal.     Mouth/Throat:     Mouth: Mucous membranes are moist.     Pharynx: No posterior oropharyngeal erythema.  Eyes:     Conjunctiva/sclera: Conjunctivae normal.     Pupils: Pupils are equal, round, and reactive to light.  Cardiovascular:     Rate and Rhythm: Normal rate and regular rhythm.     Heart sounds: Normal heart sounds.  Pulmonary:     Effort: Pulmonary effort is normal. No respiratory distress.     Breath sounds: Normal breath sounds.  Abdominal:     General: There is no distension.     Palpations: Abdomen is soft.  Musculoskeletal:        General: Normal range of motion.     Cervical back: Normal range of motion.  Lymphadenopathy:     Cervical: No cervical adenopathy.  Skin:    General: Skin is warm and dry.  Neurological:     Mental Status: She is alert.      UC Treatments / Results   Labs (all labs ordered are listed, but only abnormal results are displayed) Labs Reviewed - No data to display  EKG   Radiology No results found.  Procedures Procedures (including critical Meza time)  Medications Ordered in UC Medications - No data to display  Initial Impression / Assessment and Plan / UC Course  I have reviewed the triage vital signs and the nursing notes.  Pertinent labs & imaging results that were available during my Meza of the patient were reviewed by me and considered in my medical decision making (see chart for details).     Really do not have a diagnosis based on her symptoms and normal physical exam.  Patient does have a cough and feels like this is getting worse.  Since it has been going on for 2 weeks I will treat her with antibiotics but cautioned her that she may need to see her primary doctor if it does not improve Final Clinical Impressions(s) / UC Diagnoses   Final diagnoses:  Influenza-like illness     Discharge Instructions      Try to get enough rest Drink lots of water Take the antibiotic as directed.  2 pills today then 1 a day until gone Take prednisone  daily for 5 days.  This helps with allergies and congestion See your doctor if not improving by next week   ED Prescriptions     Medication Sig Dispense Auth. Provider   predniSONE  (DELTASONE ) 20 MG tablet Take 2 tablets (40 mg total) by mouth daily with breakfast. 10 tablet Maranda Jamee Jacob, MD   azithromycin  (ZITHROMAX  Z-PAK) 250 MG tablet Take two pills today followed by one a day until gone 6 tablet Maranda Jamee Jacob, MD      PDMP not reviewed this encounter.   Maranda Jamee Jacob, MD 04/07/24 [phone]

## 2024-04-13 ENCOUNTER — Encounter: Payer: Self-pay | Admitting: Emergency Medicine

## 2024-04-13 ENCOUNTER — Ambulatory Visit
Admission: EM | Admit: 2024-04-13 | Discharge: 2024-04-13 | Disposition: A | Discharge status: Home / Self Care | Attending: Family Medicine | Admitting: Family Medicine

## 2024-04-13 ENCOUNTER — Telehealth: Payer: Self-pay | Admitting: Emergency Medicine

## 2024-04-13 ENCOUNTER — Ambulatory Visit

## 2024-04-13 DIAGNOSIS — R0789 Other chest pain: Secondary | ICD-10-CM | POA: Diagnosis not present

## 2024-04-13 DIAGNOSIS — R29898 Other symptoms and signs involving the musculoskeletal system: Secondary | ICD-10-CM | POA: Diagnosis not present

## 2024-04-13 DIAGNOSIS — R5383 Other fatigue: Secondary | ICD-10-CM

## 2024-04-13 DIAGNOSIS — R0981 Nasal congestion: Secondary | ICD-10-CM

## 2024-04-13 MED ORDER — BACLOFEN 10 MG PO TABS: 10.0000 mg | ORAL_TABLET | Freq: Every evening | ORAL | 0 refills | Status: AC | PRN

## 2024-04-13 MED ORDER — NAPROXEN SODIUM 550 MG PO TABS: 550.0000 mg | ORAL_TABLET | Freq: Two times a day (BID) | ORAL | 0 refills | Status: AC

## 2024-04-13 MED ORDER — AZELASTINE HCL 0.1 % NA SOLN: 2.0000 | Freq: Two times a day (BID) | NASAL | 12 refills | Status: AC

## 2024-04-13 NOTE — Discharge Instructions (Addendum)
 Take Anaprox 2 times a day with food.  This is an anti-inflammatory medicine that will help with the chest discomfort and headache Use Astelin twice a day to help with the sinus congestion I prescribed baclofen.  This is a muscle relaxer to take at bedtime.  You may take during the day but it can cause drowsiness. Make sure you are drinking lots of water Follow-up with your primary care doctor, or primary care of choice

## 2024-04-13 NOTE — ED Provider Notes (Signed)
 Jillian Meza [facility]    CSN: [id number] Arrival date & time: 04/13/24  1153      History   Chief Complaint Chief Complaint  Patient presents with   Chest Tightness    HPI Jillian Meza is a 58 y.o. female.   Patient was seen a few days ago for vague symptoms of fatigue and cough.  Her symptoms been going on for 2 weeks.  She was treated with a Z-Pak and prednisone .  She states that she is no better.  In addition she called with new symptoms.  She states that she has some heaviness in the right side of her chest.  She is more short of breath.  She states that she also had a heavy sensation in her right arm.  No numbness.  No weakness.  She cannot further describe it.  She is able to move her arm fully.  She has a headache but she thinks it is from her sinus congestion.  No sore throat.  No fever or chills. Does not sleep well because of menopause Denies stress as a factor     Past Medical History:  Diagnosis Date   High cholesterol     Patient Active Problem List   Diagnosis Date Noted   Multifactorial left shoulder pain 01/29/2019    Past Surgical History:  Procedure Laterality Date   CESAREAN SECTION     CHOLECYSTECTOMY      OB History   No obstetric history on file.      Home Medications    Prior to Admission medications   Medication Sig Start Date End Date Taking? Authorizing Provider  azelastine (ASTELIN) 0.1 % nasal spray Place 2 sprays into both nostrils 2 (two) times daily. Use in each nostril as directed 04/13/24  Yes Maranda Jamee Jacob, MD  baclofen (LIORESAL) 10 MG tablet Take 1 tablet (10 mg total) by mouth at bedtime as needed and may repeat dose one time if needed for muscle spasms. 04/13/24  Yes Maranda Jamee Jacob, MD  naproxen sodium (ANAPROX DS) 550 MG tablet Take 1 tablet (550 mg total) by mouth 2 (two) times daily with a meal. 04/13/24  Yes Maranda Jamee Jacob, MD  simvastatin (ZOCOR) 10 MG tablet Take 10 mg by mouth every evening. 03/29/24   Yes [provider]    Family History Family History  Problem Relation Age of Onset   Cancer Other        BREAST    Social History Social History   Tobacco Use   Smoking status: Never   Smokeless tobacco: Never  Vaping Use   Vaping status: Never Used  Substance Use Topics   Alcohol use: No   Drug use: No     Allergies   Codeine, Erythromycin, and Percocet [oxycodone-acetaminophen]   Review of Systems Review of Systems See HPI  Physical Exam Triage Vital Signs ED Triage Vitals  Encounter Vitals Group     BP 04/13/24 1207 126/86     Girls Systolic BP Percentile --      Girls Diastolic BP Percentile --      Boys Systolic BP Percentile --      Boys Diastolic BP Percentile --      Pulse Rate 04/13/24 1207 74     Resp 04/13/24 1207 18     Temp 04/13/24 1207 97.9 F (36.6 C)     Temp Source 04/13/24 1207 Oral     SpO2 04/13/24 1207 97 %     Weight 04/13/24  1206 170 lb (77.1 kg)     Height 04/13/24 1206 5' 6 (1.676 m)     Head Circumference --      Peak Flow --      Pain Score 04/13/24 1206 0     Pain Loc --      Pain Education --      Exclude from Growth Chart --    No data found.  Updated Vital Signs BP 126/86 (BP Location: Right Arm)   Pulse 74   Temp 97.9 F (36.6 C) (Oral)   Resp 18   Ht 5' 6 (1.676 m)   Wt 77.1 kg   SpO2 97%   BMI 27.44 kg/m      Physical Exam Constitutional:      General: She is not in acute distress.    Appearance: She is well-developed.     Comments: Appears tired  HENT:     Head: Normocephalic and atraumatic.  Eyes:     Conjunctiva/sclera: Conjunctivae normal.     Pupils: Pupils are equal, round, and reactive to light.  Cardiovascular:     Rate and Rhythm: Normal rate and regular rhythm.     Heart sounds: Normal heart sounds.  Pulmonary:     Effort: Pulmonary effort is normal. No respiratory distress.     Breath sounds: Normal breath sounds.  Chest:     Chest wall: No tenderness.  Musculoskeletal:         General: Normal range of motion.     Cervical back: Normal range of motion. No rigidity or tenderness.  Skin:    General: Skin is warm and dry.  Neurological:     General: No focal deficit present.     Mental Status: She is alert.      UC Treatments / Results  Labs (all labs ordered are listed, but only abnormal results are displayed) Labs Reviewed - No data to display  EKG   Radiology DG Cervical Spine Complete Result Date: 04/13/2024 EXAM: 6 or more VIEW(S) XRAY OF THE CERVICAL SPINE 04/13/2024 12:47:41 PM COMPARISON: None available. CLINICAL HISTORY: Right chest pain and right arm heaviness. FINDINGS: BONES: No acute fracture. No aggressive appearing osseous lesion. Alignment is normal. DISCS AND DEGENERATIVE CHANGES: No severe degenerative changes. No static instability is radiographically apparent. SOFT TISSUES: No prevertebral soft tissue swelling. The visualized lungs appear clear. IMPRESSION: 1. No cervical spine fracture or static instability is radiographically apparent. Electronically signed by: Ryan Salvage MD 04/13/2024 01:09 PM EST RP Workstation: [id number]   DG Chest 2 View Result Date: 04/13/2024 CLINICAL DATA:  Right-sided chest pain. EXAM: CHEST - 2 VIEW COMPARISON:  Chest radiograph dated 04/18/2012. FINDINGS: The heart size and mediastinal contours are within normal limits. Both lungs are clear. The visualized skeletal structures are unremarkable. IMPRESSION: No active cardiopulmonary disease. Electronically Signed   By: Vanetta Chou M.D.   On: 04/13/2024 13:05    Procedures Procedures (including critical care time)  Medications Ordered in UC Medications - No data to display  Initial Impression / Assessment and Plan / UC Course  I have reviewed the triage vital signs and the nursing notes.  Pertinent labs & imaging results that were available during my care of the patient were reviewed by me and considered in my medical decision making (see  chart for details).     Again, patient has symptoms that are difficult to assess.  I have encouraged her to go back to her primary care doctor.  We discussed that  with weakness in her arm/a heavy sensation, she likely has some pinched nerve symptoms.  She does have some increased muscle tension in her neck.  She has never had disc disease before.  She worries because her mother had a series of mini strokes, although her mother did have an autoimmune disease that made her more fragile.  This patient does not have any other risk factors for stroke, no diabetes hypertension.  Well-controlled hyperlipidemia. Final Clinical Impressions(s) / UC Diagnoses   Final diagnoses:  Right-sided chest wall pain  Weakness of right arm  Other fatigue  Sinus congestion     Discharge Instructions      Take Anaprox 2 times a day with food.  This is an anti-inflammatory medicine that will help with the chest discomfort and headache Use Astelin twice a day to help with the sinus congestion I prescribed baclofen.  This is a muscle relaxer to take at bedtime.  You may take during the day but it can cause drowsiness. Make sure you are drinking lots of water Follow-up with your primary care doctor, or primary care of choice   ED Prescriptions     Medication Sig Dispense Auth. Provider   azelastine (ASTELIN) 0.1 % nasal spray Place 2 sprays into both nostrils 2 (two) times daily. Use in each nostril as directed 30 mL Maranda Jamee Jacob, MD   baclofen (LIORESAL) 10 MG tablet Take 1 tablet (10 mg total) by mouth at bedtime as needed and may repeat dose one time if needed for muscle spasms. 30 each Maranda Jamee Jacob, MD   naproxen sodium (ANAPROX DS) 550 MG tablet Take 1 tablet (550 mg total) by mouth 2 (two) times daily with a meal. 30 tablet Maranda Jamee Jacob, MD      PDMP not reviewed this encounter.   Maranda Jamee Jacob, MD 04/13/24 [phone]

## 2024-04-13 NOTE — Telephone Encounter (Signed)
 Patient states that she is still congested and having a heavy feeling behind her breast that radiates to her back.  Advised patient that she would need to be re-evaluated here or with her PCP since it is a new complaint.  Patient voices understanding.

## 2024-04-13 NOTE — ED Triage Notes (Signed)
 Patient states that she feels a heaviness behind her right breast, right arm and hand since yesterday.  The heaviness radiates to her back.  Having difficulty grabbing things, I.e. pen.  Denies any OTC meds.
